# Patient Record
Sex: Female | Born: 1945 | Race: White | Hispanic: No | Marital: Married | Smoking: Never smoker
Health system: Southern US, Community
[De-identification: ages and names within clinical notes are randomized; demographics above are authoritative.]

## PROBLEM LIST (undated history)

## (undated) DIAGNOSIS — G500 Trigeminal neuralgia: Secondary | ICD-10-CM

## (undated) DIAGNOSIS — K449 Diaphragmatic hernia without obstruction or gangrene: Secondary | ICD-10-CM

## (undated) DIAGNOSIS — M4802 Spinal stenosis, cervical region: Secondary | ICD-10-CM

## (undated) DIAGNOSIS — R251 Tremor, unspecified: Secondary | ICD-10-CM

## (undated) DIAGNOSIS — N200 Calculus of kidney: Secondary | ICD-10-CM

## (undated) DIAGNOSIS — M1990 Unspecified osteoarthritis, unspecified site: Secondary | ICD-10-CM

## (undated) DIAGNOSIS — I10 Essential (primary) hypertension: Secondary | ICD-10-CM

## (undated) DIAGNOSIS — M3500 Sicca syndrome, unspecified: Secondary | ICD-10-CM

## (undated) HISTORY — PX: THYROGLOSSAL DUCT CYST: SHX297

## (undated) HISTORY — PX: TUBAL LIGATION: SHX77

## (undated) HISTORY — PX: ANTERIOR CERVICAL DECOMP/DISCECTOMY FUSION: SHX1161

## (undated) HISTORY — PX: ANKLE FRACTURE SURGERY: SHX122

## (undated) HISTORY — PX: ABDOMINAL HYSTERECTOMY: SHX81

## (undated) HISTORY — PX: SPINE SURGERY: SHX786

## (undated) HISTORY — PX: FRACTURE SURGERY: SHX138

---

## 1998-10-17 ENCOUNTER — Emergency Department (HOSPITAL_COMMUNITY): Admission: EM | Admit: 1998-10-17 | Discharge: 1998-10-17 | Payer: Self-pay | Admitting: Emergency Medicine

## 1998-10-17 ENCOUNTER — Encounter: Payer: Self-pay | Admitting: Family Medicine

## 1999-09-17 ENCOUNTER — Ambulatory Visit (HOSPITAL_COMMUNITY): Admission: RE | Admit: 1999-09-17 | Discharge: 1999-09-17 | Payer: Self-pay | Admitting: Gastroenterology

## 2000-04-25 ENCOUNTER — Encounter: Admission: RE | Admit: 2000-04-25 | Discharge: 2000-04-25 | Payer: Self-pay | Admitting: Obstetrics and Gynecology

## 2000-04-25 ENCOUNTER — Encounter: Payer: Self-pay | Admitting: Obstetrics and Gynecology

## 2000-06-10 ENCOUNTER — Other Ambulatory Visit: Admission: RE | Admit: 2000-06-10 | Discharge: 2000-06-10 | Payer: Self-pay | Admitting: Obstetrics and Gynecology

## 2001-05-04 ENCOUNTER — Encounter: Admission: RE | Admit: 2001-05-04 | Discharge: 2001-05-04 | Payer: Self-pay | Admitting: Obstetrics and Gynecology

## 2001-05-04 ENCOUNTER — Encounter: Payer: Self-pay | Admitting: Obstetrics and Gynecology

## 2001-05-11 ENCOUNTER — Encounter: Admission: RE | Admit: 2001-05-11 | Discharge: 2001-05-11 | Payer: Self-pay | Admitting: Family Medicine

## 2001-06-10 ENCOUNTER — Inpatient Hospital Stay (HOSPITAL_COMMUNITY): Admission: RE | Admit: 2001-06-10 | Discharge: 2001-06-11 | Payer: Self-pay | Admitting: Orthopaedic Surgery

## 2001-08-14 ENCOUNTER — Other Ambulatory Visit: Admission: RE | Admit: 2001-08-14 | Discharge: 2001-08-14 | Payer: Self-pay | Admitting: Internal Medicine

## 2002-03-19 ENCOUNTER — Encounter: Payer: Self-pay | Admitting: Obstetrics and Gynecology

## 2002-03-19 ENCOUNTER — Encounter: Admission: RE | Admit: 2002-03-19 | Discharge: 2002-03-19 | Payer: Self-pay | Admitting: Obstetrics and Gynecology

## 2002-07-02 ENCOUNTER — Encounter: Payer: Self-pay | Admitting: Obstetrics and Gynecology

## 2002-07-02 ENCOUNTER — Encounter: Admission: RE | Admit: 2002-07-02 | Discharge: 2002-07-02 | Payer: Self-pay | Admitting: Obstetrics and Gynecology

## 2003-02-27 ENCOUNTER — Emergency Department (HOSPITAL_COMMUNITY): Admission: EM | Admit: 2003-02-27 | Discharge: 2003-02-27 | Payer: Self-pay | Admitting: Emergency Medicine

## 2003-07-07 ENCOUNTER — Encounter: Payer: Self-pay | Admitting: Obstetrics and Gynecology

## 2003-07-07 ENCOUNTER — Encounter: Admission: RE | Admit: 2003-07-07 | Discharge: 2003-07-07 | Payer: Self-pay | Admitting: Obstetrics and Gynecology

## 2004-07-09 ENCOUNTER — Encounter: Admission: RE | Admit: 2004-07-09 | Discharge: 2004-07-09 | Payer: Self-pay | Admitting: Obstetrics and Gynecology

## 2005-07-15 ENCOUNTER — Encounter: Admission: RE | Admit: 2005-07-15 | Discharge: 2005-07-15 | Payer: Self-pay | Admitting: Obstetrics and Gynecology

## 2005-07-23 ENCOUNTER — Encounter: Admission: RE | Admit: 2005-07-23 | Discharge: 2005-07-23 | Payer: Self-pay | Admitting: Obstetrics and Gynecology

## 2006-07-18 ENCOUNTER — Encounter: Admission: RE | Admit: 2006-07-18 | Discharge: 2006-07-18 | Payer: Self-pay | Admitting: Obstetrics and Gynecology

## 2006-11-02 ENCOUNTER — Emergency Department (HOSPITAL_COMMUNITY): Admission: EM | Admit: 2006-11-02 | Discharge: 2006-11-03 | Payer: Self-pay | Admitting: Emergency Medicine

## 2006-11-06 ENCOUNTER — Ambulatory Visit (HOSPITAL_COMMUNITY): Admission: RE | Admit: 2006-11-06 | Discharge: 2006-11-07 | Payer: Self-pay | Admitting: Orthopedic Surgery

## 2007-09-08 ENCOUNTER — Encounter: Admission: RE | Admit: 2007-09-08 | Discharge: 2007-09-08 | Payer: Self-pay | Admitting: Family Medicine

## 2007-11-30 ENCOUNTER — Emergency Department (HOSPITAL_COMMUNITY): Admission: EM | Admit: 2007-11-30 | Discharge: 2007-11-30 | Payer: Self-pay | Admitting: Emergency Medicine

## 2008-10-13 ENCOUNTER — Encounter: Admission: RE | Admit: 2008-10-13 | Discharge: 2008-10-13 | Payer: Self-pay | Admitting: Family Medicine

## 2009-11-06 ENCOUNTER — Encounter: Admission: RE | Admit: 2009-11-06 | Discharge: 2009-11-06 | Payer: Self-pay | Admitting: Family Medicine

## 2010-12-25 ENCOUNTER — Encounter
Admission: RE | Admit: 2010-12-25 | Discharge: 2010-12-25 | Payer: Self-pay | Source: Home / Self Care | Attending: Internal Medicine | Admitting: Internal Medicine

## 2011-04-26 NOTE — Op Note (Signed)
[name], [name]                ACCOUNT NO.:  [id number]   MEDICAL RECORD NO.:  [id number]          PATIENT TYPE:  OIB   LOCATION:  5035                         FACILITY:  [facility]   PHYSICIAN:  Vania Rea. Supple, M.D.  DATE OF BIRTH:  29-May-1946   DATE OF PROCEDURE:  11/06/2006  DATE OF DISCHARGE:                               OPERATIVE REPORT   PREOPERATIVE DIAGNOSIS:  Displaced right ankle trimalleolar fracture  dislocation.   POSTOPERATIVE DIAGNOSIS:  Displaced right ankle trimalleolar fracture  dislocation.   PROCEDURE:  Open reduction and internal fixation of right trimalleolar  ankle fracture dislocation.   SURGEON:  Vania Rea. Supple, M.D.   ASSISTANT:  French Ana Shuford, P.A.-C.   ANESTHESIA:  Was a general endotracheal.   ESTIMATED BLOOD LOSS:  Minimal.   TOURNIQUET TIME:  Approximately 2 hours.   DRAINS:  None.   HISTORY:  Kayla Sampson is a 65 year old female who injured her right ankle  this past Sunday sustaining a trimalleolar fracture dislocation.  I  performed a closed reduction and splinting in the emergency room at  [facility] this past Sunday, and she is now brought to the operating  room for planned definitive open reduction and internal fixation.   We then counseled Ms. Muratore on treatment options as well as risks  versus benefits thereof.  Possible surgical complications of bleeding,  infection, neurovascular injury, DVT, PE, malunion, nonunion, loss of  fixation, post-traumatic arthritis and possible need for additional  surgery reviewed.  She understands, accepts and agrees with the planned  procedure.   PROCEDURE IN DETAIL:  After undergoing routine preoperative evaluation,  the patient received prophylactic antibiotics.  Brought to the operating  room and placed spine on the operating room table and underwent smooth  induction of a general endotracheal anesthesia.  Tourniquet applied to  the right thigh, and right leg was sterilely prepped and draped  in  standard fashion.  Leg was exsanguinated with the tourniquet inflated to  350 mmHg.  I began with a longitudinal incision over the medial aspect  of the distal tibia and ankle approximately 10 cm in length.  Skin flaps  were elevated and dissection then carried deeply to the distal tibial  metaphysis and over the medial malleolus and then slightly distal.  Dissection carried deeply to the tibial metaphysis were subperiosteal  dissection then used to expose anteriorly and posteriorly around the  distal tibia to allow inspection and manipulation of the posterior  malleolar fragment.  The posterior malleolar fragment did constitute 50%  of the articular surface, and so reduction and fixation was essential.  The medial malleolar fragment was rather small.  It was reflected  distally on the deltoid ligament pedicle, and this allowed inspection of  the articular surfaces.  The joint was copiously irrigated, and all  interposed soft tissue was meticulously removed.  We then turned our  attention laterally where a 10-cm incision was then made over the distal  fibular shaft, and the skin flaps were elevated, and dissection was then  carried deeply with peroneal musculature and tendons reflected  posteriorly and subperiosteal  dissection used to expose the distal  fibular shaft.  The oblique fracture site was then exposed, and  interposed soft tissue was removed.  A direct reduction was performed,  and a 7 hole plate was then contoured to fit over the posterior cortex  of the distal fibula centered over the fracture site.  This construct  was then clamped in position, and then a series of 3.5 cortical screws  were placed proximally, a lag screw obliquely across the fracture site  and then locking screws x2 distally.  Good bony purchase was achieved,  and anatomic alignment was directly visualized.  Fluoroscopic images  showed the hardware to be in good position and good alignment along the   fracture site.  Attention was then redirected medially, and using  fluoroscopic guidance, we then directly reduced the posterior malleolar  fragment and used a series of three 4.0 cancellous screws cannulated  self-tapping from anterior to posterior, lagging the posterior malleolar  fragment and maintaining good alignment at the articular surface.  Fluoroscopic images were used to confirm proper alignment and good  reduction.  We then at this point repaired the small medial malleolar  fracture fragment, again using two 4.0 self-tapping cannulated screws.  Good purchase was obtained, and good alignment was achieved clinically.  Fluoroscopic images were then obtained which showed good position of all  the hardware and good alignment across all of the fracture sites.  At  this point, the wounds were all then copiously irrigated.  Incisions  were closed with 0 Vicryl for the deep layers, 2-0 Vicryl for subcu and  intracuticular 3-0 Monocryl for the skin medially.  Steri-Strips were  applied over the incisions medially and laterally.  Bulky well-padded  short-leg plaster U splint was then applied with the ankle in neutral  position.  The tourniquet was then let down, and the patient was  extubated and taken to the recovery room in stable condition.      Vania Rea. Supple, M.D.  Electronically Signed     KMS/MEDQ  D:  11/06/2006  T:  11/07/2006  Job:  [id number]

## 2011-04-26 NOTE — Op Note (Signed)
[place]  Patient:    Kayla Sampson, Kayla Sampson                       MRN: [id number] Proc. Date: 06/10/01 Adm. 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  OPERATION PERFORMED:  C5-6, C6-7 anterior cervical diskectomy and fusion with left iliac crest bone graft.  SURGEON:  Mark C. Ophelia Charter, M.D.  ASSISTANT:  Colon Flattery. Ollen Bowl, M.D.  ANESTHESIA:  GOT.  ESTIMATED BLOOD LOSS:  100 ml.  DRAINS:  One Hemovac in the neck.  DESCRIPTION OF PROCEDURE:  After induction of general anesthesia with orotracheal intubation, head halter traction application, sand bagged behind the neck, Gelbag behind the iliac crest, the neck and left iliac crest were prepped and squared with towels, sterile skin marker, Betadine Vi-drape application.  Sterile Mayo stand at the head, thyroid sheets and drapes were applied.  An incision was made over the C6 vertebra starting with the midline and extending to the left in line with the skin fold.  The platysma was divided in line with the skin incision and then undermined.  The carotid sheath and contents were lateral and esophagus, trachea was toward the midline.  The longus colli was visualized in the midline and needle stuck at 4-5 which was the disk space above the spurring at 5-6 and C6-7.  A cross table lateral x-ray confirmed this.  Approach was first made to C5-6 with teethed retractors placed underneath the longus colli and smooth blades up and down.  Diskectomy was performed with a 15 scalpel blade and Cloward curets were used for scraping the gutters progressing up to a #1.  Uncovertebral joints were tight, particularly on the left.  Progressive drilling with the offset barrel for a 12 mm hole  was performed.  This left the level of the disk extending from the 8 to the 4 oclock position looking down toward the posterior cortex.  Progressive drilling was performed until the posterior cortex was encountered.  Operative microscope was draped and brought in.  There was a bleeder at the 6 oclock position which was controlled with bone wax.  The operating microscope draped and in position.  The posterior longitudinal ligament was taken down. Uncovertebral joints were stripped again and spurs were removed in the left gutter as well as the right.  Once the posterior longitudinal ligament was removed and the dura was well visualized and decompressed, a 14 mm plug was taken from the left iliac crest splitting the fascia in line with its fibers, using the head halter distraction and impacted into place flush with the anterior cortex based on depth gauge measurements.  An identical procedure was repeated at C6-7.  Self-retaining retractors were moved carefully and drilling down to the posterior cortex, there was a free fragment of disk which was extruded underneath the ligament sitting on the nerve root directly over the anterior portion of the nerve root.  The nerve root was erythematous.  With decompression of the uncovertebral joint, the spur of the uncovertebral joint was removed, removing part of the uncovertebral joint which was the inferior aspect.  This gave excellent visualization of the nerve  root and after irrigation with saline solution and all posterior ligament taken down with direct visualization of the dura, a second plug was taken parallel to the first.  This was thin, going from outer to inner table and the graft was countersunk 2 to 3 mm.  Neck was rotated.  Excellent stability of both grafts. Irrigation was performed.  There was room on each side for egress of any blood.  The wound was dry.  Hemovac drain was placed in line with the skin incision.  The platysma closed with  3-0, 4-0 Vicryl subcuticular skin closure. Iliac crest closed with 0 Vicryl, 2-0 Vicryl and skin staple closure. Marcaine infiltrated into both crest and neck.  The patient was transferred to the recovery room in stable condition.  Instrument and instrument counts were correct. DD:  06/10/01 TD:  06/10/01 Job: [id number]

## 2011-12-19 ENCOUNTER — Other Ambulatory Visit: Payer: Self-pay | Admitting: Internal Medicine

## 2011-12-19 DIAGNOSIS — Z1231 Encounter for screening mammogram for malignant neoplasm of breast: Secondary | ICD-10-CM

## 2012-01-07 ENCOUNTER — Ambulatory Visit
Admission: RE | Admit: 2012-01-07 | Discharge: 2012-01-07 | Disposition: A | Discharge status: Home / Self Care | Payer: BC Managed Care – PPO | Source: Ambulatory Visit | Attending: Internal Medicine | Admitting: Internal Medicine

## 2012-01-07 DIAGNOSIS — Z1231 Encounter for screening mammogram for malignant neoplasm of breast: Secondary | ICD-10-CM

## 2013-01-04 ENCOUNTER — Other Ambulatory Visit: Payer: Self-pay | Admitting: Internal Medicine

## 2013-01-04 DIAGNOSIS — Z1231 Encounter for screening mammogram for malignant neoplasm of breast: Secondary | ICD-10-CM

## 2013-01-28 ENCOUNTER — Ambulatory Visit
Admission: RE | Admit: 2013-01-28 | Discharge: 2013-01-28 | Disposition: A | Discharge status: Home / Self Care | Payer: BC Managed Care – PPO | Source: Ambulatory Visit | Attending: Internal Medicine | Admitting: Internal Medicine

## 2013-01-28 DIAGNOSIS — Z1231 Encounter for screening mammogram for malignant neoplasm of breast: Secondary | ICD-10-CM

## 2014-01-05 ENCOUNTER — Other Ambulatory Visit: Payer: Self-pay

## 2014-01-05 DIAGNOSIS — Z1231 Encounter for screening mammogram for malignant neoplasm of breast: Secondary | ICD-10-CM

## 2014-02-02 ENCOUNTER — Ambulatory Visit: Payer: BC Managed Care – PPO

## 2014-02-10 ENCOUNTER — Ambulatory Visit
Admission: RE | Admit: 2014-02-10 | Discharge: 2014-02-10 | Disposition: A | Discharge status: Home / Self Care | Payer: 59 | Source: Ambulatory Visit

## 2014-02-10 DIAGNOSIS — Z1231 Encounter for screening mammogram for malignant neoplasm of breast: Secondary | ICD-10-CM

## 2015-02-08 ENCOUNTER — Other Ambulatory Visit: Payer: Self-pay

## 2015-02-08 DIAGNOSIS — Z1231 Encounter for screening mammogram for malignant neoplasm of breast: Secondary | ICD-10-CM

## 2015-02-15 ENCOUNTER — Ambulatory Visit
Admission: RE | Admit: 2015-02-15 | Discharge: 2015-02-15 | Disposition: A | Discharge status: Home / Self Care | Payer: Medicare Other | Source: Ambulatory Visit

## 2015-02-15 DIAGNOSIS — Z1231 Encounter for screening mammogram for malignant neoplasm of breast: Secondary | ICD-10-CM

## 2015-12-10 HISTORY — DX: Omphalitis not of newborn: L08.82

## 2016-02-11 ENCOUNTER — Encounter (HOSPITAL_BASED_OUTPATIENT_CLINIC_OR_DEPARTMENT_OTHER): Payer: Self-pay | Admitting: *Deleted

## 2016-02-11 ENCOUNTER — Emergency Department (HOSPITAL_BASED_OUTPATIENT_CLINIC_OR_DEPARTMENT_OTHER)
Admission: EM | Admit: 2016-02-11 | Discharge: 2016-02-11 | Disposition: A | Discharge status: Home / Self Care | Payer: Medicare Other | Attending: Emergency Medicine | Admitting: Emergency Medicine

## 2016-02-11 ENCOUNTER — Emergency Department (HOSPITAL_BASED_OUTPATIENT_CLINIC_OR_DEPARTMENT_OTHER): Payer: Medicare Other

## 2016-02-11 DIAGNOSIS — M3500 Sicca syndrome, unspecified: Secondary | ICD-10-CM | POA: Insufficient documentation

## 2016-02-11 DIAGNOSIS — Y9389 Activity, other specified: Secondary | ICD-10-CM | POA: Insufficient documentation

## 2016-02-11 DIAGNOSIS — L03116 Cellulitis of left lower limb: Secondary | ICD-10-CM | POA: Diagnosis not present

## 2016-02-11 DIAGNOSIS — Z8719 Personal history of other diseases of the digestive system: Secondary | ICD-10-CM | POA: Diagnosis not present

## 2016-02-11 DIAGNOSIS — I10 Essential (primary) hypertension: Secondary | ICD-10-CM | POA: Insufficient documentation

## 2016-02-11 DIAGNOSIS — M1990 Unspecified osteoarthritis, unspecified site: Secondary | ICD-10-CM | POA: Insufficient documentation

## 2016-02-11 DIAGNOSIS — Z7982 Long term (current) use of aspirin: Secondary | ICD-10-CM | POA: Diagnosis not present

## 2016-02-11 DIAGNOSIS — Z23 Encounter for immunization: Secondary | ICD-10-CM | POA: Diagnosis not present

## 2016-02-11 DIAGNOSIS — Z8669 Personal history of other diseases of the nervous system and sense organs: Secondary | ICD-10-CM | POA: Insufficient documentation

## 2016-02-11 DIAGNOSIS — Z87442 Personal history of urinary calculi: Secondary | ICD-10-CM | POA: Diagnosis not present

## 2016-02-11 DIAGNOSIS — W1839XA Other fall on same level, initial encounter: Secondary | ICD-10-CM | POA: Insufficient documentation

## 2016-02-11 DIAGNOSIS — Y9289 Other specified places as the place of occurrence of the external cause: Secondary | ICD-10-CM | POA: Diagnosis not present

## 2016-02-11 DIAGNOSIS — S8992XA Unspecified injury of left lower leg, initial encounter: Secondary | ICD-10-CM | POA: Diagnosis present

## 2016-02-11 DIAGNOSIS — L02416 Cutaneous abscess of left lower limb: Secondary | ICD-10-CM | POA: Insufficient documentation

## 2016-02-11 DIAGNOSIS — Z79899 Other long term (current) drug therapy: Secondary | ICD-10-CM | POA: Diagnosis not present

## 2016-02-11 DIAGNOSIS — L02419 Cutaneous abscess of limb, unspecified: Secondary | ICD-10-CM

## 2016-02-11 DIAGNOSIS — T148XXA Other injury of unspecified body region, initial encounter: Secondary | ICD-10-CM

## 2016-02-11 DIAGNOSIS — Y998 Other external cause status: Secondary | ICD-10-CM | POA: Diagnosis not present

## 2016-02-11 DIAGNOSIS — S8002XA Contusion of left knee, initial encounter: Secondary | ICD-10-CM | POA: Insufficient documentation

## 2016-02-11 DIAGNOSIS — L03119 Cellulitis of unspecified part of limb: Secondary | ICD-10-CM

## 2016-02-11 HISTORY — DX: Spinal stenosis, cervical region: M48.02

## 2016-02-11 HISTORY — DX: Calculus of kidney: N20.0

## 2016-02-11 HISTORY — DX: Trigeminal neuralgia: G50.0

## 2016-02-11 HISTORY — DX: Unspecified osteoarthritis, unspecified site: M19.90

## 2016-02-11 HISTORY — DX: Essential (primary) hypertension: I10

## 2016-02-11 HISTORY — DX: Sjogren syndrome, unspecified: M35.00

## 2016-02-11 HISTORY — DX: Diaphragmatic hernia without obstruction or gangrene: K44.9

## 2016-02-11 HISTORY — DX: Tremor, unspecified: R25.1

## 2016-02-11 MED ORDER — TETANUS-DIPHTH-ACELL PERTUSSIS 5-2.5-18.5 LF-MCG/0.5 IM SUSP
0.5000 mL | Freq: Once | INTRAMUSCULAR | Status: AC
  Administered 2016-02-11: 0.5 mL via INTRAMUSCULAR
  Filled 2016-02-11: qty 0.5

## 2016-02-11 MED ORDER — DOXYCYCLINE HYCLATE 100 MG PO CAPS: 100.0000 mg | ORAL_CAPSULE | Freq: Two times a day (BID) | ORAL | Status: DC

## 2016-02-11 NOTE — ED Notes (Signed)
Pt reports a fall on 01/28/16 injuring her left knee, pt states yesterday she began developing redness and edema - knee is also hot to touch.

## 2016-02-11 NOTE — ED Provider Notes (Signed)
CSN: [id number]     Arrival date & time 02/11/16  1736 History   First MD Initiated Contact with Patient 02/11/16 1830     Chief Complaint  Patient presents with  . Knee Pain     (Consider location/radiation/quality/duration/timing/severity/associated sxs/prior Treatment) HPI   Pt with hx cervical stenosis of spine with occasional associated balance issues presents with left knee pain following fall on February 19 (2 weeks ago).  States she fell forward onto her left knee, had a cut over the knee.  It was initially red, they put neosporin on it and it improved.  Yesterday she developed increased soreness and redness of the same area.  Denies fevers, weakness or numbness of the leg, leg swelling, calf pain, difficulty moving the joint.    Past Medical History  Diagnosis Date  . Cervical stenosis of spine   . Sjoegren syndrome (HCC)   . Hypertension   . Trigeminal neuralgia pain   . Hiatal hernia   . Occasional tremors   . Osteoarthritis   . Kidney stone    Past Surgical History  Procedure Laterality Date  . Thyroglossal duct cyst    . Tubal ligation    . Abdominal hysterectomy    . Anterior cervical decomp/discectomy fusion    . Ankle fracture surgery     History reviewed. No pertinent family history. Social History  Substance Use Topics  . Smoking status: Never Smoker   . Smokeless tobacco: None  . Alcohol Use: No   OB History    No data available     Review of Systems  All other systems reviewed and are negative.     Allergies  Lisinopril  Home Medications   Prior to Admission medications   Medication Sig Start Date End Date Taking? Authorizing Provider  aspirin EC 81 MG tablet Take 81 mg by mouth daily.   Yes Historical Provider, MD  cevimeline (EVOXAC) 30 MG capsule Take 30 mg by mouth 3 (three) times daily.   Yes Historical Provider, MD  DULoxetine (CYMBALTA) 20 MG capsule Take 20 mg by mouth daily.   Yes Historical Provider, MD  hydroxychloroquine  (PLAQUENIL) 200 MG tablet Take by mouth daily.   Yes Historical Provider, MD  metoprolol tartrate (LOPRESSOR) 25 MG tablet Take 25 mg by mouth 2 (two) times daily.   Yes Historical Provider, MD  ranitidine (ZANTAC) 75 MG tablet Take 75 mg by mouth 2 (two) times daily.   Yes Historical Provider, MD  risedronate (ACTONEL) 35 MG tablet Take 35 mg by mouth every 7 (seven) days. with water on empty stomach, nothing by mouth or lie down for next 30 minutes.   Yes Historical Provider, MD   BP 179/73 mmHg  Pulse 72  Temp(Src) 97.3 F (36.3 C) (Oral)  Resp 18  Ht 5\' 4"  (1.626 m)  Wt 89.359 kg  BMI 33.80 kg/m2  SpO2 100% Physical Exam  Constitutional: She appears well-developed and well-nourished. No distress.  HENT:  Head: Normocephalic and atraumatic.  Neck: Neck supple.  Pulmonary/Chest: Effort normal.  Musculoskeletal:       Left knee: She exhibits ecchymosis and erythema. She exhibits normal range of motion, no effusion, no deformity, normal alignment, no LCL laxity, no bony tenderness and no MCL laxity.       Left ankle: Normal.       Left upper leg: Normal.       Left lower leg: She exhibits tenderness and bony tenderness. She exhibits no swelling, no edema, no  deformity and no laceration.       Left foot: Normal.  Left Lower Extremity:  Left anterior knee with area of swelling with tenderness, ecchymosis, slight erythema.  Pt also has light ecchymosis over distal anteromedial lower leg with mild tenderness.  Full active and passive ROM of left knee without pain.  No laxity of the joint.  Distal pulses and sensation intact.  No fullness or tenderness of the calf of thigh.    Neurological: She is alert.  Skin: She is not diaphoretic.  Nursing note and vitals reviewed.   ED Course  Procedures (including critical care time) Labs Review Labs Reviewed - No data to display  Imaging Review Dg Tibia/fibula Left  02/11/2016  CLINICAL DATA:  Fall 01/28/2016 injuring left knee. Swelling and  ecchymosis over anterior knee and distal shin. EXAM: LEFT TIBIA AND FIBULA - 2 VIEW COMPARISON:  None. FINDINGS: No fracture or dislocation. Ankle alignment appears maintained. Soft tissue prominence about the proximal lower leg anteriorly. No radiopaque foreign body. IMPRESSION: No fracture.  Soft tissue prominence anteriorly. Electronically Signed   By: Rubye Oaks M.D.   On: 02/11/2016 19:25   Dg Knee Complete 4 Views Left  02/11/2016  CLINICAL DATA:  Fall 01/28/2016 injuring left knee. Anterior knee swelling with ecchymosis. EXAM: LEFT KNEE - COMPLETE 4+ VIEW COMPARISON:  None. FINDINGS: No fracture or dislocation. The alignment is maintained. Medial tibial femoral mild patellofemoral joint space narrowing. Superior patellar spurring. No joint effusion. Anterior soft tissue prominence, primarily in the prepatellar region. No soft tissue air. IMPRESSION: 1. No fracture or dislocation.  Mild osteoarthritis. 2. Prepatellar soft tissue prominence, may be related to hematoma. Electronically Signed   By: Rubye Oaks M.D.   On: 02/11/2016 19:23      EKG Interpretation None      MDM   Final diagnoses:  Left knee injury, initial encounter  Hematoma  Cellulitis and abscess of leg    Afebrile nontoxic patient with localized superficial swelling over left anterior knee at site of injury two weeks ago.  Neurovascularly intact.  No diffuse edema, no calf tenderness, no palpable cords.   Does not involve the joint itself- pt has normal ROM without pain, no effusion.Pt also seen and examined by Dr Fredderick Phenix. Suspect hematoma with possible small area of cellulitis related to the cut she sustained from the fall.  Tdap updated. Xray unremarkable.  D/C home with doxycycline, 2 day recheck. Pt given return precautions.  Pt verbalizes understanding and agrees with plan.      I doubt any other EMC precluding discharge at this time including, but not necessarily limited to the following: septic joint, DVT.     Trixie Dredge, PA-C 02/11/16 2100  Rolan Bucco, MD 02/11/16 2123

## 2016-02-11 NOTE — Discharge Instructions (Signed)
Read the information below.  Use the prescribed medication as directed.  Please discuss all new medications with your pharmacist.  You may return to the Emergency Department at any time for worsening condition or any new symptoms that concern you.    If you develop fever, uncontrolled pain, weakness or numbness of the extremity, severe discoloration of the skin, increased redness or swelling, or you are unable to walk, return to the ER for a recheck.      Cellulitis Cellulitis is an infection of the skin and the tissue beneath it. The infected area is usually red and tender. Cellulitis occurs most often in the arms and lower legs.  CAUSES  Cellulitis is caused by bacteria that enter the skin through cracks or cuts in the skin. The most common types of bacteria that cause cellulitis are staphylococci and streptococci. SIGNS AND SYMPTOMS   Redness and warmth.  Swelling.  Tenderness or pain.  Fever. DIAGNOSIS  Your health care provider can usually determine what is wrong based on a physical exam. Blood tests may also be done. TREATMENT  Treatment usually involves taking an antibiotic medicine. HOME CARE INSTRUCTIONS   Take your antibiotic medicine as directed by your health care provider. Finish the antibiotic even if you start to feel better.  Keep the infected arm or leg elevated to reduce swelling.  Apply a warm cloth to the affected area up to 4 times per day to relieve pain.  Take medicines only as directed by your health care provider.  Keep all follow-up visits as directed by your health care provider. SEEK MEDICAL CARE IF:   You notice red streaks coming from the infected area.  Your red area gets larger or turns dark in color.  Your bone or joint underneath the infected area becomes painful after the skin has healed.  Your infection returns in the same area or another area.  You notice a swollen bump in the infected area.  You develop new symptoms.  You have a  fever. SEEK IMMEDIATE MEDICAL CARE IF:   You feel very sleepy.  You develop vomiting or diarrhea.  You have a general ill feeling (malaise) with muscle aches and pains.   This information is not intended to replace advice given to you by your health care provider. Make sure you discuss any questions you have with your health care provider.   Document Released: 09/04/2005 Document Revised: 08/16/2015 Document Reviewed: 02/10/2012 Elsevier Interactive Patient Education [employer]2016 [employer]vier Inc.

## 2016-02-11 NOTE — ED Notes (Signed)
PA at bedside.

## 2016-02-13 DIAGNOSIS — H2513 Age-related nuclear cataract, bilateral: Secondary | ICD-10-CM

## 2016-02-13 DIAGNOSIS — M1990 Unspecified osteoarthritis, unspecified site: Secondary | ICD-10-CM | POA: Insufficient documentation

## 2016-02-13 DIAGNOSIS — J309 Allergic rhinitis, unspecified: Secondary | ICD-10-CM

## 2016-02-13 DIAGNOSIS — M8580 Other specified disorders of bone density and structure, unspecified site: Secondary | ICD-10-CM | POA: Insufficient documentation

## 2016-02-13 DIAGNOSIS — K219 Gastro-esophageal reflux disease without esophagitis: Secondary | ICD-10-CM | POA: Insufficient documentation

## 2016-02-13 DIAGNOSIS — L0882 Omphalitis not of newborn: Secondary | ICD-10-CM | POA: Insufficient documentation

## 2016-02-13 DIAGNOSIS — M4802 Spinal stenosis, cervical region: Secondary | ICD-10-CM

## 2016-02-13 DIAGNOSIS — H524 Presbyopia: Secondary | ICD-10-CM | POA: Insufficient documentation

## 2016-02-13 DIAGNOSIS — Z79899 Other long term (current) drug therapy: Secondary | ICD-10-CM

## 2016-02-13 DIAGNOSIS — L570 Actinic keratosis: Secondary | ICD-10-CM | POA: Insufficient documentation

## 2016-02-13 DIAGNOSIS — H269 Unspecified cataract: Secondary | ICD-10-CM | POA: Insufficient documentation

## 2016-02-13 DIAGNOSIS — M3501 Sicca syndrome with keratoconjunctivitis: Secondary | ICD-10-CM | POA: Insufficient documentation

## 2016-02-13 HISTORY — DX: Actinic keratosis: L57.0

## 2016-02-13 HISTORY — DX: Allergic rhinitis, unspecified: J30.9

## 2016-02-13 HISTORY — DX: Other long term (current) drug therapy: Z79.899

## 2016-02-13 HISTORY — DX: Other specified disorders of bone density and structure, unspecified site: M85.80

## 2016-02-13 HISTORY — DX: Presbyopia: H52.4

## 2016-02-13 HISTORY — DX: Unspecified cataract: H26.9

## 2016-02-13 HISTORY — DX: Unspecified osteoarthritis, unspecified site: M19.90

## 2016-02-13 HISTORY — DX: Sjogren syndrome with keratoconjunctivitis: M35.01

## 2016-02-13 HISTORY — DX: Age-related nuclear cataract, bilateral: H25.13

## 2016-02-13 HISTORY — DX: Spinal stenosis, cervical region: M48.02

## 2016-02-13 HISTORY — DX: Gastro-esophageal reflux disease without esophagitis: K21.9

## 2016-02-20 ENCOUNTER — Other Ambulatory Visit: Payer: Self-pay | Admitting: Internal Medicine

## 2016-02-20 DIAGNOSIS — N649 Disorder of breast, unspecified: Secondary | ICD-10-CM

## 2016-02-23 ENCOUNTER — Ambulatory Visit
Admission: RE | Admit: 2016-02-23 | Discharge: 2016-02-23 | Disposition: A | Discharge status: Home / Self Care | Payer: Medicare Other | Source: Ambulatory Visit | Attending: Internal Medicine | Admitting: Internal Medicine

## 2016-02-23 DIAGNOSIS — N649 Disorder of breast, unspecified: Secondary | ICD-10-CM

## 2016-03-06 DIAGNOSIS — D649 Anemia, unspecified: Secondary | ICD-10-CM

## 2016-03-06 DIAGNOSIS — S52501A Unspecified fracture of the lower end of right radius, initial encounter for closed fracture: Secondary | ICD-10-CM

## 2016-03-06 DIAGNOSIS — W19XXXA Unspecified fall, initial encounter: Secondary | ICD-10-CM | POA: Insufficient documentation

## 2016-03-06 DIAGNOSIS — I10 Essential (primary) hypertension: Secondary | ICD-10-CM

## 2016-03-06 DIAGNOSIS — S52614A Nondisplaced fracture of right ulna styloid process, initial encounter for closed fracture: Secondary | ICD-10-CM | POA: Insufficient documentation

## 2016-03-06 DIAGNOSIS — Y92009 Unspecified place in unspecified non-institutional (private) residence as the place of occurrence of the external cause: Secondary | ICD-10-CM | POA: Insufficient documentation

## 2016-03-06 DIAGNOSIS — S62101A Fracture of unspecified carpal bone, right wrist, initial encounter for closed fracture: Secondary | ICD-10-CM

## 2016-03-06 HISTORY — DX: Unspecified fracture of the lower end of right radius, initial encounter for closed fracture: S52.501A

## 2016-03-06 HISTORY — DX: Unspecified fall, initial encounter: Y92.009

## 2016-03-06 HISTORY — DX: Fracture of unspecified carpal bone, right wrist, initial encounter for closed fracture: S62.101A

## 2016-03-06 HISTORY — DX: Nondisplaced fracture of right ulna styloid process, initial encounter for closed fracture: S52.614A

## 2016-03-06 HISTORY — DX: Essential (primary) hypertension: I10

## 2016-03-06 HISTORY — DX: Anemia, unspecified: D64.9

## 2016-04-08 DIAGNOSIS — R2689 Other abnormalities of gait and mobility: Secondary | ICD-10-CM

## 2016-04-08 DIAGNOSIS — M542 Cervicalgia: Secondary | ICD-10-CM

## 2016-04-08 HISTORY — DX: Cervicalgia: M54.2

## 2016-04-08 HISTORY — DX: Other abnormalities of gait and mobility: R26.89

## 2017-01-30 ENCOUNTER — Other Ambulatory Visit: Payer: Self-pay | Admitting: Internal Medicine

## 2017-01-30 DIAGNOSIS — Z1231 Encounter for screening mammogram for malignant neoplasm of breast: Secondary | ICD-10-CM

## 2017-02-27 ENCOUNTER — Ambulatory Visit
Admission: RE | Admit: 2017-02-27 | Discharge: 2017-02-27 | Disposition: A | Discharge status: Home / Self Care | Payer: Medicare Other | Source: Ambulatory Visit | Attending: Internal Medicine | Admitting: Internal Medicine

## 2017-02-27 DIAGNOSIS — Z1231 Encounter for screening mammogram for malignant neoplasm of breast: Secondary | ICD-10-CM

## 2017-04-01 DIAGNOSIS — R0602 Shortness of breath: Secondary | ICD-10-CM

## 2017-04-01 HISTORY — DX: Shortness of breath: R06.02

## 2017-04-30 DIAGNOSIS — F320 Major depressive disorder, single episode, mild: Secondary | ICD-10-CM | POA: Insufficient documentation

## 2017-04-30 HISTORY — DX: Major depressive disorder, single episode, mild: F32.0

## 2017-06-30 DIAGNOSIS — H353131 Nonexudative age-related macular degeneration, bilateral, early dry stage: Secondary | ICD-10-CM | POA: Insufficient documentation

## 2017-06-30 DIAGNOSIS — Z79899 Other long term (current) drug therapy: Secondary | ICD-10-CM

## 2017-06-30 HISTORY — DX: Nonexudative age-related macular degeneration, bilateral, early dry stage: H35.3131

## 2017-06-30 HISTORY — DX: Other long term (current) drug therapy: Z79.899

## 2017-09-06 LAB — GLUCOSE, POCT (MANUAL RESULT ENTRY): POC Glucose: 133 mg/dL — AB (ref 70–99)

## 2018-02-27 ENCOUNTER — Other Ambulatory Visit: Payer: Self-pay | Admitting: Internal Medicine

## 2018-02-27 DIAGNOSIS — Z1231 Encounter for screening mammogram for malignant neoplasm of breast: Secondary | ICD-10-CM

## 2018-03-24 ENCOUNTER — Ambulatory Visit
Admission: RE | Admit: 2018-03-24 | Discharge: 2018-03-24 | Disposition: A | Discharge status: Home / Self Care | Payer: Medicare Other | Source: Ambulatory Visit | Attending: Internal Medicine | Admitting: Internal Medicine

## 2018-03-24 DIAGNOSIS — Z1231 Encounter for screening mammogram for malignant neoplasm of breast: Secondary | ICD-10-CM

## 2018-11-30 DIAGNOSIS — H43393 Other vitreous opacities, bilateral: Secondary | ICD-10-CM | POA: Insufficient documentation

## 2018-11-30 HISTORY — DX: Other vitreous opacities, bilateral: H43.393

## 2019-02-12 ENCOUNTER — Other Ambulatory Visit: Payer: Self-pay | Admitting: Internal Medicine

## 2019-02-12 DIAGNOSIS — Z1231 Encounter for screening mammogram for malignant neoplasm of breast: Secondary | ICD-10-CM

## 2019-03-29 ENCOUNTER — Ambulatory Visit: Payer: Medicare Other

## 2019-05-17 ENCOUNTER — Ambulatory Visit
Admission: RE | Admit: 2019-05-17 | Discharge: 2019-05-17 | Disposition: A | Discharge status: Home / Self Care | Payer: Medicare Other | Source: Ambulatory Visit | Attending: Internal Medicine | Admitting: Internal Medicine

## 2019-05-17 ENCOUNTER — Other Ambulatory Visit: Payer: Self-pay

## 2019-05-17 DIAGNOSIS — Z1231 Encounter for screening mammogram for malignant neoplasm of breast: Secondary | ICD-10-CM

## 2019-05-20 DIAGNOSIS — M545 Low back pain, unspecified: Secondary | ICD-10-CM

## 2019-05-20 HISTORY — DX: Low back pain, unspecified: M54.50

## 2020-01-17 ENCOUNTER — Ambulatory Visit: Payer: Medicare PPO | Attending: Internal Medicine

## 2020-01-17 DIAGNOSIS — Z23 Encounter for immunization: Secondary | ICD-10-CM | POA: Insufficient documentation

## 2020-01-17 NOTE — Progress Notes (Signed)
   Covid-19 Vaccination Clinic  Name:  INELLA KUWAHARA    MRN: [id number] DOB: 01-23-1946  01/17/2020  Ms. Principato was observed post Covid-19 immunization for 15 minutes without incidence. She was provided with Vaccine Information Sheet and instruction to access the V-Safe system.   Ms. Stelzner was instructed to call 911 with any severe reactions post vaccine: Marland Kitchen Difficulty breathing  . Swelling of your face and throat  . A fast heartbeat  . A bad rash all over your body  . Dizziness and weakness    Immunizations Administered    Name Date Dose VIS Date Route   Pfizer COVID-19 Vaccine 01/17/2020 10:56 AM 0.3 mL 11/19/2019 Intramuscular   Manufacturer: [employer], [employer]   Lot: [id number]   NDC: [address]-8

## 2020-01-22 ENCOUNTER — Ambulatory Visit: Payer: Medicare Other

## 2020-02-11 ENCOUNTER — Ambulatory Visit: Payer: Medicare PPO | Attending: Internal Medicine

## 2020-02-11 ENCOUNTER — Other Ambulatory Visit: Payer: Self-pay

## 2020-02-11 DIAGNOSIS — Z23 Encounter for immunization: Secondary | ICD-10-CM | POA: Insufficient documentation

## 2020-02-11 NOTE — Progress Notes (Signed)
   Covid-19 Vaccination Clinic  Name:  Kayla Sampson    MRN: [id number] DOB: 08/14/1946  02/11/2020  [name] was observed post Covid-19 immunization for 15 minutes without incident. She was provided with Vaccine Information Sheet and instruction to access the V-Safe system.   [name] was instructed to call 911 with any severe reactions post vaccine: Marland Kitchen Difficulty breathing  . Swelling of face and throat  . A fast heartbeat  . A bad rash all over body  . Dizziness and weakness   Immunizations Administered    Name Date Dose VIS Date Route   Pfizer COVID-19 Vaccine 02/11/2020  8:45 AM 0.3 mL 11/19/2019 Intramuscular   Manufacturer: [employer], [employer]   Lot: [id number]   NDC: [address]-3

## 2020-02-16 DIAGNOSIS — N200 Calculus of kidney: Secondary | ICD-10-CM

## 2020-02-16 DIAGNOSIS — Z79899 Other long term (current) drug therapy: Secondary | ICD-10-CM

## 2020-02-16 DIAGNOSIS — R269 Unspecified abnormalities of gait and mobility: Secondary | ICD-10-CM

## 2020-02-16 HISTORY — DX: Other long term (current) drug therapy: Z79.899

## 2020-02-16 HISTORY — DX: Calculus of kidney: N20.0

## 2020-02-16 HISTORY — DX: Unspecified abnormalities of gait and mobility: R26.9

## 2020-02-28 DIAGNOSIS — H0102A Squamous blepharitis right eye, upper and lower eyelids: Secondary | ICD-10-CM | POA: Insufficient documentation

## 2020-02-28 DIAGNOSIS — H18529 Epithelial (juvenile) corneal dystrophy, unspecified eye: Secondary | ICD-10-CM

## 2020-02-28 DIAGNOSIS — H04123 Dry eye syndrome of bilateral lacrimal glands: Secondary | ICD-10-CM

## 2020-02-28 HISTORY — DX: Epithelial (juvenile) corneal dystrophy, unspecified eye: H18.529

## 2020-02-28 HISTORY — DX: Dry eye syndrome of bilateral lacrimal glands: H04.123

## 2020-02-28 HISTORY — DX: Squamous blepharitis right eye, upper and lower eyelids: H01.02A

## 2020-05-11 ENCOUNTER — Other Ambulatory Visit: Payer: Self-pay | Admitting: Internal Medicine

## 2020-05-11 DIAGNOSIS — Z1231 Encounter for screening mammogram for malignant neoplasm of breast: Secondary | ICD-10-CM

## 2020-05-19 ENCOUNTER — Other Ambulatory Visit: Payer: Self-pay

## 2020-05-19 ENCOUNTER — Ambulatory Visit
Admission: RE | Admit: 2020-05-19 | Discharge: 2020-05-19 | Disposition: A | Discharge status: Home / Self Care | Payer: Medicare PPO | Source: Ambulatory Visit | Attending: Internal Medicine | Admitting: Internal Medicine

## 2020-05-19 DIAGNOSIS — Z1231 Encounter for screening mammogram for malignant neoplasm of breast: Secondary | ICD-10-CM

## 2020-09-27 ENCOUNTER — Other Ambulatory Visit: Payer: Self-pay | Admitting: Internal Medicine

## 2021-04-30 ENCOUNTER — Other Ambulatory Visit: Payer: Self-pay

## 2021-04-30 DIAGNOSIS — Z1231 Encounter for screening mammogram for malignant neoplasm of breast: Secondary | ICD-10-CM

## 2021-05-05 DIAGNOSIS — N179 Acute kidney failure, unspecified: Secondary | ICD-10-CM

## 2021-05-05 HISTORY — DX: Acute kidney failure, unspecified: N17.9

## 2021-06-25 ENCOUNTER — Other Ambulatory Visit: Payer: Self-pay

## 2021-06-25 ENCOUNTER — Ambulatory Visit
Admission: RE | Admit: 2021-06-25 | Discharge: 2021-06-25 | Disposition: A | Discharge status: Home / Self Care | Payer: Medicare PPO | Source: Ambulatory Visit

## 2021-06-25 DIAGNOSIS — Z1231 Encounter for screening mammogram for malignant neoplasm of breast: Secondary | ICD-10-CM

## 2021-06-26 DIAGNOSIS — B029 Zoster without complications: Secondary | ICD-10-CM

## 2021-06-26 HISTORY — DX: Zoster without complications: B02.9

## 2021-11-03 DIAGNOSIS — E6609 Other obesity due to excess calories: Secondary | ICD-10-CM

## 2021-11-03 HISTORY — DX: Other obesity due to excess calories: E66.09

## 2022-04-11 ENCOUNTER — Emergency Department (HOSPITAL_BASED_OUTPATIENT_CLINIC_OR_DEPARTMENT_OTHER): Payer: Medicare PPO

## 2022-04-11 ENCOUNTER — Emergency Department (HOSPITAL_BASED_OUTPATIENT_CLINIC_OR_DEPARTMENT_OTHER): Payer: Medicare PPO | Admitting: Radiology

## 2022-04-11 ENCOUNTER — Other Ambulatory Visit: Payer: Self-pay

## 2022-04-11 ENCOUNTER — Emergency Department (HOSPITAL_BASED_OUTPATIENT_CLINIC_OR_DEPARTMENT_OTHER)
Admission: EM | Admit: 2022-04-11 | Discharge: 2022-04-11 | Disposition: A | Discharge status: Home / Self Care | Payer: Medicare PPO | Attending: Emergency Medicine | Admitting: Emergency Medicine

## 2022-04-11 ENCOUNTER — Encounter (HOSPITAL_BASED_OUTPATIENT_CLINIC_OR_DEPARTMENT_OTHER): Payer: Self-pay | Admitting: *Deleted

## 2022-04-11 DIAGNOSIS — S60511A Abrasion of right hand, initial encounter: Secondary | ICD-10-CM | POA: Diagnosis not present

## 2022-04-11 DIAGNOSIS — M79671 Pain in right foot: Secondary | ICD-10-CM | POA: Insufficient documentation

## 2022-04-11 DIAGNOSIS — Y9301 Activity, walking, marching and hiking: Secondary | ICD-10-CM | POA: Diagnosis not present

## 2022-04-11 DIAGNOSIS — Z7982 Long term (current) use of aspirin: Secondary | ICD-10-CM | POA: Insufficient documentation

## 2022-04-11 DIAGNOSIS — Y92481 Parking lot as the place of occurrence of the external cause: Secondary | ICD-10-CM | POA: Insufficient documentation

## 2022-04-11 DIAGNOSIS — W101XXA Fall (on)(from) sidewalk curb, initial encounter: Secondary | ICD-10-CM | POA: Diagnosis not present

## 2022-04-11 DIAGNOSIS — S0033XA Contusion of nose, initial encounter: Secondary | ICD-10-CM | POA: Diagnosis not present

## 2022-04-11 DIAGNOSIS — S0083XA Contusion of other part of head, initial encounter: Secondary | ICD-10-CM | POA: Diagnosis not present

## 2022-04-11 DIAGNOSIS — S0990XA Unspecified injury of head, initial encounter: Secondary | ICD-10-CM | POA: Diagnosis present

## 2022-04-11 DIAGNOSIS — I10 Essential (primary) hypertension: Secondary | ICD-10-CM | POA: Diagnosis not present

## 2022-04-11 DIAGNOSIS — Z79899 Other long term (current) drug therapy: Secondary | ICD-10-CM | POA: Insufficient documentation

## 2022-04-11 DIAGNOSIS — W19XXXA Unspecified fall, initial encounter: Secondary | ICD-10-CM

## 2022-04-11 MED ORDER — ACETAMINOPHEN 500 MG PO TABS
1000.0000 mg | ORAL_TABLET | Freq: Once | ORAL | Status: AC
  Administered 2022-04-11: 1000 mg via ORAL
  Filled 2022-04-11: qty 2

## 2022-04-11 NOTE — ED Notes (Signed)
Non stick dressing applied to abrasions on hand ?

## 2022-04-11 NOTE — Discharge Instructions (Signed)
As we discussed, your work-up in the ER today was reassuring for acute abnormalities.  I recommend rest, ice, compression, and elevation of areas of pain.  You may also take Tylenol/ibuprofen as needed.  Additionally given some questionable findings on your CT scan regarding a potential polyp or mass near your vocal cords, I have given you a referral to ENT with a number to call for further evaluation and management of this. ? ?Return if development of any new or worsening symptoms. ?

## 2022-04-11 NOTE — ED Triage Notes (Signed)
Pt is here after tripping on curb and striking her head and abrasion to fingers on right hand.  No LOC, not on blood thinners.  ?

## 2022-04-11 NOTE — ED Provider Notes (Signed)
?[facility]PT ?Provider Note ? ? ?CSN: [id number] ?Arrival date & time: 04/11/22  1559 ? ?  ? ?History ? ?Chief Complaint  ?Patient presents with  ? Fall  ? ? ?Kayla Sampson is a 76 y.o. female. ? ?Patient with history of hypertension and Sjogrens presents today with complaints of fall. She states that same occurred earlier today when she was walking though the parking lot. States that she was walking holding a box and couldn't see where she was going and tripped on the curb and fell with her face striking the pavement. She denies any loss of consciousness and states that she was able to get up and walk around on scene without difficulty. She is not currently anticoagulated. She does endorse some pain and swelling to the face and nose with associated right hand and right foot pain. Denies any blurred vision, dizziness, or lightheadedness.   ? ?The history is provided by the patient. No language interpreter was used.  ?Fall ?Associated symptoms include headaches.  ? ?  ? ?Home Medications ?Prior to Admission medications   ?Medication Sig Start Date End Date Taking? Authorizing Provider  ?aspirin EC 81 MG tablet Take 81 mg by mouth daily.    [provider]  ?cevimeline (EVOXAC) 30 MG capsule Take 30 mg by mouth 3 (three) times daily.    [provider]  ?doxycycline (VIBRAMYCIN) 100 MG capsule Take 1 capsule (100 mg total) by mouth 2 (two) times daily. One po bid x 7 days 02/11/16   Trixie Dredge, PA-C  ?DULoxetine (CYMBALTA) 20 MG capsule Take 20 mg by mouth daily.    [provider]  ?hydroxychloroquine (PLAQUENIL) 200 MG tablet Take by mouth daily.    [provider]  ?metoprolol tartrate (LOPRESSOR) 25 MG tablet Take 25 mg by mouth 2 (two) times daily.    [provider]  ?ranitidine (ZANTAC) 75 MG tablet Take 75 mg by mouth 2 (two) times daily.    [provider]  ?risedronate (ACTONEL) 35 MG tablet Take 35 mg by mouth every 7 (seven)  days. with water on empty stomach, nothing by mouth or lie down for next 30 minutes.    [provider]  ?   ? ?Allergies    ?Lisinopril   ? ?Review of Systems   ?Review of Systems  ?Constitutional:  Negative for chills and fever.  ?Eyes:  Negative for photophobia and visual disturbance.  ?Musculoskeletal:  Positive for arthralgias and myalgias.  ?Neurological:  Positive for headaches. Negative for dizziness, tremors, seizures, syncope, facial asymmetry, speech difficulty, weakness, light-headedness and numbness.  ?Psychiatric/Behavioral:  Negative for confusion and decreased concentration.   ?All other systems reviewed and are negative. ? ?Physical Exam ?Updated Vital Signs ?BP (!) 145/54   Pulse 65   Temp 98.2 ?F (36.8 ?C) (Oral)   Resp 14   SpO2 100%  ?Physical Exam ?Vitals and nursing note reviewed.  ?Constitutional:   ?   General: She is not in acute distress. ?   Appearance: Normal appearance. She is normal weight. She is not ill-appearing, toxic-appearing or diaphoretic.  ?HENT:  ?   Head: Normocephalic and atraumatic.  ?   Comments: Patient with hematoma to the middle of the forehead at the hair line. No crepitus or wound. Small hematoma noted to the bridge of the nose. ?Eyes:  ?   Extraocular Movements: Extraocular movements intact.  ?   Pupils: Pupils are equal, round, and reactive to light.  ?Cardiovascular:  ?  Rate and Rhythm: Normal rate and regular rhythm.  ?   Heart sounds: Normal heart sounds.  ?Pulmonary:  ?   Effort: Pulmonary effort is normal. No respiratory distress.  ?   Breath sounds: Normal breath sounds.  ?Abdominal:  ?   General: Abdomen is flat.  ?   Palpations: Abdomen is soft.  ?Musculoskeletal:     ?   General: Normal range of motion.  ?   Cervical back: Normal range of motion and neck supple. No tenderness.  ?   Comments: No midline cervical, thoracic, or lumbar tenderness ? ?Several abrasions noted to the back of fingers 2-5 on the right hand. Bleeding controlled. No  obvious foreign bodies noted. Full ROM intact to the right hand and fingers. Capillary refill less than 2 seconds.  ? ?Tenderness to palpation to the base of the right great toe. No obvious swelling, bruising, or deformity noted. No wounds present.  ?Skin: ?   General: Skin is warm and dry.  ?Neurological:  ?   General: No focal deficit present.  ?   Mental Status: She is alert.  ?Psychiatric:     ?   Mood and Affect: Mood normal.     ?   Behavior: Behavior normal.  ? ? ?ED Results / Procedures / Treatments   ?Labs ?(all labs ordered are listed, but only abnormal results are displayed) ?Labs Reviewed - No data to display ? ?EKG ?None ? ?Radiology ?CT Head Wo Contrast ? ?Result Date: 04/11/2022 ?CLINICAL DATA:  Fall with trauma to the head, face and neck. EXAM: CT HEAD WITHOUT CONTRAST CT MAXILLOFACIAL WITHOUT CONTRAST CT CERVICAL SPINE WITHOUT CONTRAST TECHNIQUE: Multidetector CT imaging of the head, cervical spine, and maxillofacial structures were performed using the standard protocol without intravenous contrast. Multiplanar CT image reconstructions of the cervical spine and maxillofacial structures were also generated. RADIATION DOSE REDUCTION: This exam was performed according to the departmental dose-optimization program which includes automated exposure control, adjustment of the mA and/or kV according to patient size and/or use of iterative reconstruction technique. COMPARISON:  11/01/2015.  06/24/2013. FINDINGS: CT HEAD FINDINGS Brain: Mild age related volume loss. No evidence of old or acute focal infarction, mass lesion, hemorrhage, hydrocephalus or extra-axial collection. Vascular: There is atherosclerotic calcification of the major vessels at the base of the brain. Skull: No skull fracture. Other: Right frontal scalp hematoma. CT MAXILLOFACIAL FINDINGS Osseous: No facial fracture. Orbits: Orbits appear normal.  No traumatic finding. Sinuses: Clear Soft tissues: Negative CT CERVICAL SPINE FINDINGS  Alignment: No traumatic malalignment. Skull base and vertebrae: Chronic fusion C5 through C7. No evidence of regional fracture. Soft tissues and spinal canal: Negative Disc levels: Chronic fusion from C5 through C7. Ordinary spondylosis at C3-4 and C4-5 but no canal stenosis. Left facet degeneration at C3-4 and C4-5 with mild left foraminal narrowing. Upper chest: Negative Other: Question polyp or mass at the aryepiglottic fold on the right. Suggest ENT referral for evaluation of this finding. IMPRESSION: Head CT: No skull fracture. No traumatic intracranial finding. Right frontal scalp swelling. Maxillofacial CT: No traumatic finding. Cervical spine CT: No traumatic finding. Chronic fusion C5 through C7. Mild degenerative changes. Question polyp or mass associated with the aryepiglottic fold on the right, measuring up to 1.5 cm in size. Suggest ENT referral for follow-up of this abnormality. Electronically Signed   By: Paulina FusiMark  Shogry M.D.   On: 04/11/2022 21:11  ? ?CT Cervical Spine Wo Contrast ? ?Result Date: 04/11/2022 ?CLINICAL DATA:  Fall with trauma to  the head, face and neck. EXAM: CT HEAD WITHOUT CONTRAST CT MAXILLOFACIAL WITHOUT CONTRAST CT CERVICAL SPINE WITHOUT CONTRAST TECHNIQUE: Multidetector CT imaging of the head, cervical spine, and maxillofacial structures were performed using the standard protocol without intravenous contrast. Multiplanar CT image reconstructions of the cervical spine and maxillofacial structures were also generated. RADIATION DOSE REDUCTION: This exam was performed according to the departmental dose-optimization program which includes automated exposure control, adjustment of the mA and/or kV according to patient size and/or use of iterative reconstruction technique. COMPARISON:  11/01/2015.  06/24/2013. FINDINGS: CT HEAD FINDINGS Brain: Mild age related volume loss. No evidence of old or acute focal infarction, mass lesion, hemorrhage, hydrocephalus or extra-axial collection.  Vascular: There is atherosclerotic calcification of the major vessels at the base of the brain. Skull: No skull fracture. Other: Right frontal scalp hematoma. CT MAXILLOFACIAL FINDINGS Osseous: No facial fracture. Orbits: Or

## 2022-05-08 DIAGNOSIS — J387 Other diseases of larynx: Secondary | ICD-10-CM

## 2022-05-08 HISTORY — DX: Other diseases of larynx: J38.7

## 2022-05-15 ENCOUNTER — Other Ambulatory Visit: Payer: Self-pay

## 2022-05-15 DIAGNOSIS — Z1231 Encounter for screening mammogram for malignant neoplasm of breast: Secondary | ICD-10-CM

## 2022-05-17 DIAGNOSIS — R7303 Prediabetes: Secondary | ICD-10-CM | POA: Insufficient documentation

## 2022-05-17 HISTORY — DX: Prediabetes: R73.03

## 2022-05-31 ENCOUNTER — Ambulatory Visit: Payer: Medicare PPO | Admitting: Family Medicine

## 2022-05-31 ENCOUNTER — Encounter: Payer: Self-pay | Admitting: Family Medicine

## 2022-05-31 VITALS — BP 158/70 | HR 60 | Temp 96.9°F | Ht 64.0 in | Wt 189.4 lb

## 2022-05-31 DIAGNOSIS — M3509 Sicca syndrome with other organ involvement: Secondary | ICD-10-CM

## 2022-05-31 DIAGNOSIS — R7303 Prediabetes: Secondary | ICD-10-CM | POA: Diagnosis not present

## 2022-05-31 DIAGNOSIS — N390 Urinary tract infection, site not specified: Secondary | ICD-10-CM

## 2022-05-31 DIAGNOSIS — G250 Essential tremor: Secondary | ICD-10-CM

## 2022-05-31 DIAGNOSIS — N3281 Overactive bladder: Secondary | ICD-10-CM

## 2022-05-31 DIAGNOSIS — I10 Essential (primary) hypertension: Secondary | ICD-10-CM | POA: Diagnosis not present

## 2022-05-31 DIAGNOSIS — K219 Gastro-esophageal reflux disease without esophagitis: Secondary | ICD-10-CM

## 2022-05-31 DIAGNOSIS — K449 Diaphragmatic hernia without obstruction or gangrene: Secondary | ICD-10-CM | POA: Insufficient documentation

## 2022-05-31 DIAGNOSIS — F320 Major depressive disorder, single episode, mild: Secondary | ICD-10-CM

## 2022-05-31 HISTORY — DX: Sjogren syndrome with other organ involvement: M35.09

## 2022-05-31 HISTORY — DX: Overactive bladder: N32.81

## 2022-05-31 HISTORY — DX: Urinary tract infection, site not specified: N39.0

## 2022-05-31 HISTORY — DX: Essential tremor: G25.0

## 2022-05-31 MED ORDER — METOPROLOL SUCCINATE ER 25 MG PO TB24: 25.0000 mg | ORAL_TABLET | ORAL | 0 refills | Status: DC

## 2022-06-02 NOTE — Assessment & Plan Note (Signed)
Controlled on Myrbetriq Follows with urology

## 2022-06-02 NOTE — Assessment & Plan Note (Signed)
Stable Associated with Sjogren's Continue to monitor

## 2022-06-02 NOTE — Assessment & Plan Note (Signed)
Controlled on metoprolol, Refilled

## 2022-06-04 ENCOUNTER — Ambulatory Visit: Payer: Medicare PPO | Admitting: Medical

## 2022-06-05 ENCOUNTER — Telehealth: Payer: Self-pay | Admitting: Family Medicine

## 2022-06-05 MED ORDER — AMLODIPINE-OLMESARTAN 10-20 MG PO TABS: 1.0000 | ORAL_TABLET | Freq: Every day | ORAL | 1 refills | Status: DC

## 2022-06-05 NOTE — Telephone Encounter (Signed)
Chart supports rx. Last OV: 05/31/2022 Next OV: 06/28/2022

## 2022-06-05 NOTE — Telephone Encounter (Signed)
Advised patient that refill has been sent.  

## 2022-06-05 NOTE — Telephone Encounter (Signed)
Pt needs a refill for Amlodipine She uses [facility] on [address].

## 2022-06-06 ENCOUNTER — Other Ambulatory Visit: Payer: Self-pay

## 2022-06-07 MED ORDER — AMLODIPINE-OLMESARTAN 10-20 MG PO TABS: 1.0000 | ORAL_TABLET | Freq: Every day | ORAL | 0 refills | Status: DC

## 2022-06-07 NOTE — Telephone Encounter (Signed)
Spoke with patient after receiving request for 90 day supply of amlodipine/olmesartan. She states she normally receives a 90 day supply    Contacted [facility] and canceled 30 with 1 refill of amlodipine/olmesartan 10-20mg  tablet.

## 2022-06-25 ENCOUNTER — Ambulatory Visit (INDEPENDENT_AMBULATORY_CARE_PROVIDER_SITE_OTHER): Payer: Medicare PPO | Admitting: Family Medicine

## 2022-06-25 ENCOUNTER — Encounter: Payer: Self-pay | Admitting: Family Medicine

## 2022-06-25 DIAGNOSIS — U071 COVID-19: Secondary | ICD-10-CM

## 2022-06-25 MED ORDER — MOLNUPIRAVIR EUA 200MG CAPSULE: 4.0000 | ORAL_CAPSULE | Freq: Two times a day (BID) | ORAL | 0 refills | Status: AC

## 2022-06-25 MED ORDER — BENZONATATE 100 MG PO CAPS: 100.0000 mg | ORAL_CAPSULE | Freq: Two times a day (BID) | ORAL | 0 refills | Status: DC | PRN

## 2022-06-25 NOTE — Progress Notes (Signed)
Virtual Visit via Telephone Note  I connected with Kayla Sampson on 06/25/22 at  9:00 AM EDT by telephone and verified that I am speaking with the correct person using two identifiers.  Location: Patient: Home Provider: Office   I discussed the limitations, risks, security and privacy concerns of performing an evaluation and management service by telephone and the availability of in person appointments. I also discussed with the patient that there may be a patient responsible charge related to this service. The patient expressed understanding and agreed to proceed.   History of Present Illness:  Patient calling complaining of COVID-19 symptoms.  This started 4 days ago.  Patient did test positive x2. at home patient.  Initially developed a sore throat, this then progressed to nausea, cough, and change in taste.  Patient denies chest pain and shortness of breath.  She is tolerating PO fluids.  She has not been not taking any medication for her symptoms.  She has been vaccinated and is up-to-date on latest booster.   Observations/Objective: Respiratory: Able to talk without any distress  Assessment and Plan:  COVID-19 Given age and history of connective tissue disorder, will start molnupiravir.  Cannot take Paxlovid due to be on hydroxychloroquine. Tessalon Perles for cough Declines anything for nausea Recommend Tylenol for chills and aches Recommend go to the emergency department patient has severe pain or shortness of breath Quarantine precautions discussed  Follow Up Instructions:    I discussed the assessment and treatment plan with the patient. The patient was provided an opportunity to ask questions and all were answered. The patient agreed with the plan and demonstrated an understanding of the instructions.   The patient was advised to call back or seek an in-person evaluation if the symptoms worsen or if the condition fails to improve as anticipated.  I provided 12 minutes  of non-face-to-face time during this encounter.   Garnette Gunner, MD

## 2022-06-26 ENCOUNTER — Ambulatory Visit: Payer: Medicare PPO

## 2022-06-27 ENCOUNTER — Telehealth: Payer: Self-pay | Admitting: Family Medicine

## 2022-06-27 NOTE — Telephone Encounter (Signed)
Please advise, see below.   

## 2022-06-27 NOTE — Telephone Encounter (Signed)
Caller Name: Atha Muradyan Call back phone #: [phone]  Reason for Call: Pt said she went to pick up her medications and they only gave her 1 dose of  Metoprolol, she asked for a call back as to why.

## 2022-06-28 ENCOUNTER — Ambulatory Visit: Payer: Medicare PPO | Admitting: Family Medicine

## 2022-06-28 NOTE — Telephone Encounter (Signed)
Advised patient of annotation below and she verbalized understanding.  

## 2022-07-01 ENCOUNTER — Telehealth: Payer: Self-pay | Admitting: Family Medicine

## 2022-07-01 ENCOUNTER — Other Ambulatory Visit: Payer: Self-pay | Admitting: Family Medicine

## 2022-07-01 DIAGNOSIS — G250 Essential tremor: Secondary | ICD-10-CM

## 2022-07-01 DIAGNOSIS — I10 Essential (primary) hypertension: Secondary | ICD-10-CM

## 2022-07-01 MED ORDER — METOPROLOL SUCCINATE ER 25 MG PO TB24: 25.0000 mg | ORAL_TABLET | Freq: Every day | ORAL | 0 refills | Status: DC

## 2022-07-01 NOTE — Telephone Encounter (Signed)
Caller Name: Kaila Devries Call back phone #: [phone]  Reason for Call: Pt said that she was prescribed metroprolol by Dr. Janee Morn. Her pharmacy is stating that they have not received an order yet. Asked to be called back to speak about this

## 2022-07-01 NOTE — Telephone Encounter (Signed)
Spoke with patient and advised her of annotation below and she verbalized understanding.

## 2022-07-01 NOTE — Telephone Encounter (Signed)
Please advise, see below. Was the metoprolol just 1 pill for the month of June or is there a refill needed for July as well.  Patient scheduled for f/u on 07/08/2022

## 2022-07-08 ENCOUNTER — Encounter: Payer: Self-pay | Admitting: Family Medicine

## 2022-07-08 ENCOUNTER — Ambulatory Visit: Payer: Medicare PPO | Admitting: Family Medicine

## 2022-07-08 ENCOUNTER — Ambulatory Visit
Admission: RE | Admit: 2022-07-08 | Discharge: 2022-07-08 | Disposition: A | Discharge status: Home / Self Care | Payer: Medicare PPO | Source: Ambulatory Visit

## 2022-07-08 VITALS — BP 138/86 | HR 74 | Temp 97.6°F | Wt 187.2 lb

## 2022-07-08 DIAGNOSIS — R7303 Prediabetes: Secondary | ICD-10-CM

## 2022-07-08 DIAGNOSIS — U071 COVID-19: Secondary | ICD-10-CM

## 2022-07-08 DIAGNOSIS — G250 Essential tremor: Secondary | ICD-10-CM

## 2022-07-08 DIAGNOSIS — I10 Essential (primary) hypertension: Secondary | ICD-10-CM

## 2022-07-08 DIAGNOSIS — Z1231 Encounter for screening mammogram for malignant neoplasm of breast: Secondary | ICD-10-CM

## 2022-07-08 HISTORY — DX: COVID-19: U07.1

## 2022-07-08 LAB — HEMOGLOBIN A1C: Hgb A1c MFr Bld: 6.3 % (ref 4.6–6.5)

## 2022-07-08 NOTE — Assessment & Plan Note (Signed)
Stable Recheck hemoglobin A1c Continue metformin 500 mg daily

## 2022-07-08 NOTE — Assessment & Plan Note (Signed)
Controlled on metoprolol XR 25 mg daily Continue monitor

## 2022-07-08 NOTE — Progress Notes (Addendum)
Assessment/Plan:   Problem List Items Addressed This Visit       Cardiovascular and Mediastinum   Primary hypertension    Chronic Stable Continue amlodipine/olmesartan 10/20 mg daily, metoprolol XR 25 mg daily        Nervous and Auditory   Benign essential tremor syndrome    Controlled on metoprolol XR 25 mg daily Continue monitor         Other   Prediabetes - Primary    Stable Recheck hemoglobin A1c Continue metformin 500 mg daily      Relevant Orders   Hemoglobin A1C   COVID-19    Recent infection Overall improving Continue monitor          Subjective:  HPI:  Kayla Sampson is a 76 y.o. 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   She  has a past medical history of Cervical stenosis of spine, Hiatal hernia, Hypertension, Kidney stone, Occasional tremors, Osteoarthritis, Sjoegren syndrome, and Trigeminal neuralgia pain..   She presents with chief complaint of Follow-up (4 week follow up on B/p and tremor. Right ear pain x 1 month ago. ) .   Tremor: She complains of tremor. Tremor primarily involves the bilateral hand.  Onset of symptoms was  gradual over several years . Symptoms are currently of mild severity. Tremor exacerbated by activity (intention) and heat. Tremor is alleviated by metoprolol.   Hypertension, established problem, Stable BP Readings from Last 3 Encounters:  07/08/22 138/86  05/31/22 (!) 158/70  04/11/22 (!) 143/93    Current Medications: Amlodipine/olmesartan 10 mg / 20 mg, metoprolol XR 25 mg, compliant without  side effects. Interim History: Doing well  ROS: Denies any chest pain, shortness of breath, dyspnea on exertion, leg edema.   Patient with history of recent COVID.  She reports that she has some mild fatigue but overall no chest pain or shortness of breath.  She feels that she is improving well.  Pre-DIABETES , established problem, Stable Medications: Metformin 500 mg, Reports taking and tolerating without side effects. Interim History-no complaints  ROS: Denies denies polyuria polydipsia polyuria,Polydipsia, Denies Hypoglycemia symptoms (palpitations, tremors, anxiousness)  Hemoglobin A1c 6 months ago was 6.3   Past Surgical History:  Procedure Laterality Date   ABDOMINAL HYSTERECTOMY     ANKLE FRACTURE SURGERY     ANTERIOR CERVICAL DECOMP/DISCECTOMY FUSION     THYROGLOSSAL DUCT CYST     TUBAL LIGATION      Outpatient Medications Prior to Visit  Medication Sig Dispense Refill   amlodipine-olmesartan (AZOR) 10-20 MG tablet Take 1 tablet by mouth daily. 90 tablet 0   Cholecalciferol (VITAMIN D-1000 MAX ST) 25 MCG (1000 UT) tablet Take by mouth.     DULoxetine (CYMBALTA) 60 MG capsule TAKE 1 CAPSULE(60 MG) BY MOUTH DAILY     Flaxseed, Linseed, (FLAXSEED OIL) 1200 MG CAPS Take by mouth.     folic acid (FOLVITE) 1 MG tablet Take 1 tablet by mouth daily.     hydroxychloroquine (PLAQUENIL) 200 MG tablet Take by mouth daily.     metFORMIN (GLUCOPHAGE-XR) 500 MG 24 hr tablet Take 500 mg by mouth daily.     methotrexate (RHEUMATREX) 2.5 MG tablet SMARTSIG:7 Tablet(s) By Mouth  Once a Week     metoprolol succinate (TOPROL-XL) 25 MG 24 hr tablet TAKE 1 TABLET(25 MG) BY MOUTH DAILY 90 tablet 0   Multiple Vitamins-Minerals (CENTRUM SILVER 50+WOMEN PO) Take by mouth.     MYRBETRIQ 25 MG TB24 tablet Take 25 mg by mouth daily.     OVER THE COUNTER MEDICATION PreserVision Eye vitamin and mineral supplement     benzonatate (TESSALON) 100 MG capsule Take 1 capsule (100 mg total) by mouth 2 (two)  times daily as needed for cough. (Patient not taking: Reported on 07/08/2022) 20 capsule 0   No facility-administered medications prior to visit.    No family history on file.  Social History   Socioeconomic History   Marital status: Married    Spouse name: Not on file   Number of children: Not on file   Years of education: Not on file   Highest education level: Not on file  Occupational History   Not on file  Tobacco Use   Smoking status: Never   Smokeless tobacco: Not on file  Vaping Use   Vaping Use: Never used  Substance and Sexual Activity   Alcohol use: No   Drug use: No   Sexual activity: Not on file  Other Topics Concern   Not on file  Social History Narrative   Not on file   Social Determinants of Health   Financial Resource Strain: Not on file  Food Insecurity: Not on file  Transportation Needs: Not on file  Physical Activity: Not on file  Stress: Not on file  Social Connections: Not on file  Intimate Partner Violence: Not on file                                                                                                 Objective:  Physical Exam: BP 138/86 (BP Location: Left Arm, Patient Position: Sitting, Cuff Size: Large)   Pulse 74   Temp 97.6 F (36.4 C) (Temporal)   Wt 187 lb 3.2 oz (84.9 kg)   SpO2 97%   BMI 32.13 kg/m    General: No acute distress. Awake and conversant.  Eyes: Normal conjunctiva, anicteric. Round symmetric pupils.  ENT: Hearing grossly intact. No nasal discharge.  Neck: Neck is supple. No masses or thyromegaly.  Respiratory: Respirations are non-labored. No auditory wheezing.  Skin: Warm. No rashes or ulcers.  Psych: Alert and oriented. Cooperative, Appropriate mood and affect, Normal judgment.  CV: No cyanosis or JVD MSK: Normal ambulation. No clubbing  Neuro: Sensation and CN II-XII grossly normal.  Mild resting tremor bilateral upper extremities       Garner Nash, MD, MS

## 2022-07-08 NOTE — Patient Instructions (Signed)
Blood pressure looks good.  I am glad you are doing well from COVID. We are checking hemoglobin A1c today.

## 2022-07-08 NOTE — Assessment & Plan Note (Signed)
Chronic Stable Continue amlodipine/olmesartan 10/20 mg daily, metoprolol XR 25 mg daily

## 2022-07-08 NOTE — Assessment & Plan Note (Signed)
Recent infection Overall improving Continue monitor

## 2022-07-11 ENCOUNTER — Telehealth: Payer: Self-pay | Admitting: Family Medicine

## 2022-07-11 ENCOUNTER — Other Ambulatory Visit: Payer: Self-pay

## 2022-07-11 DIAGNOSIS — F320 Major depressive disorder, single episode, mild: Secondary | ICD-10-CM

## 2022-07-11 DIAGNOSIS — R928 Other abnormal and inconclusive findings on diagnostic imaging of breast: Secondary | ICD-10-CM

## 2022-07-11 MED ORDER — DULOXETINE HCL 60 MG PO CPEP: ORAL_CAPSULE | ORAL | 3 refills | Status: DC

## 2022-07-11 NOTE — Telephone Encounter (Signed)
Caller Name: PT Call back phone #: [phone]  MEDICATION(S): DULoxetine (CYMBALTA) 60 MG capsule [[id number]]   Days of Med Remaining: she has none  Has the patient contacted their pharmacy (YES/NO)? Yes contact your pcp  Preferred Pharmacy: [facility] #[id number] [place], [place] - [address] AT [facility] OF [address] & [address]  [address] [place] [address]  Phone:  [phone]  Fax:  [phone]  DEA #:  [id number]  ~~~Please advise patient/caregiver to allow 2-3 business days to process RX refills.

## 2022-07-11 NOTE — Telephone Encounter (Signed)
Advised patient of the annotation below and she verbalized understanding.

## 2022-07-15 ENCOUNTER — Other Ambulatory Visit: Payer: Self-pay | Admitting: Family Medicine

## 2022-07-15 ENCOUNTER — Telehealth: Payer: Self-pay | Admitting: Family Medicine

## 2022-07-15 DIAGNOSIS — R928 Other abnormal and inconclusive findings on diagnostic imaging of breast: Secondary | ICD-10-CM

## 2022-07-15 NOTE — Telephone Encounter (Signed)
Patient received mammogram on 07/08/22 that resulted further imaging is needed (MM DIAG BREAST TOMO UNI LEFT & US BREAST LTD UNI LEFT INC AXILLA), but orders are required and patient has appointment on tomorrow 07/16/2022 with [facility]. Can you please advise in Dr. Carollee Massed absence?

## 2022-07-15 NOTE — Telephone Encounter (Signed)
Caller Name: [facility] Call back phone #: [phone]  Reason for Call: Can you please confirm the order has been signed and sent to [facility] for pt to be seen 8/08

## 2022-07-16 ENCOUNTER — Ambulatory Visit
Admission: RE | Admit: 2022-07-16 | Discharge: 2022-07-16 | Disposition: A | Discharge status: Home / Self Care | Payer: Medicare PPO | Source: Ambulatory Visit | Attending: Family Medicine | Admitting: Family Medicine

## 2022-07-16 ENCOUNTER — Other Ambulatory Visit: Payer: Medicare PPO

## 2022-07-16 DIAGNOSIS — R928 Other abnormal and inconclusive findings on diagnostic imaging of breast: Secondary | ICD-10-CM

## 2022-07-26 ENCOUNTER — Telehealth: Payer: Self-pay | Admitting: Family Medicine

## 2022-07-26 MED ORDER — METFORMIN HCL ER 500 MG PO TB24: 500.0000 mg | ORAL_TABLET | Freq: Every day | ORAL | 2 refills | Status: DC

## 2022-07-26 NOTE — Telephone Encounter (Signed)
Chart supports rx. Last OV: 07/08/2022  Advised patient that there was a 90 day supply sent in of metoprolol succinate (TOPROL-XL) 25 MG on 07/01/22 #90 with 0 refills. Patient stated she will contact pharmacy. Advised patient that metformin will be sent today.

## 2022-07-26 NOTE — Telephone Encounter (Signed)
Caller Name: Thara  Call back phone #: [phone]  MEDICATION(S): metoprolol succinate (TOPROL-XL) 25 MG 24 hr tablet and  metFORMIN  Days of Med Remaining:   Has the patient contacted their pharmacy (YES/NO)? n What did pharmacy advise?  Preferred Pharmacy:  [facility] #[id number] - [place], [place] - [address] AT [facility] OF [address] & [address] Phone:  [phone]      ~~~Please advise patient/caregiver to allow 2-3 business days to process RX refills.

## 2022-08-13 ENCOUNTER — Ambulatory Visit (INDEPENDENT_AMBULATORY_CARE_PROVIDER_SITE_OTHER): Payer: Medicare PPO

## 2022-08-13 VITALS — Ht 63.0 in | Wt 185.0 lb

## 2022-08-13 DIAGNOSIS — Z0000 Encounter for general adult medical examination without abnormal findings: Secondary | ICD-10-CM | POA: Diagnosis not present

## 2022-08-13 DIAGNOSIS — Z780 Asymptomatic menopausal state: Secondary | ICD-10-CM | POA: Diagnosis not present

## 2022-08-13 NOTE — Progress Notes (Signed)
Subjective:   Kayla Sampson is a 76 y.o. female who presents for Medicare Annual (Subsequent) preventive examination.   Virtual Visit via Telephone Note  I connected with  Dennis Bast on 08/13/22 at  3:45 PM EDT by telephone and verified that I am speaking with the correct person using two identifiers.  Location: Patient: home  Provider: [place]  Persons participating in the virtual visit: patient/Nurse Health Advisor   I discussed the limitations, risks, security and privacy concerns of performing an evaluation and management service by telephone and the availability of in person appointments. The patient expressed understanding and agreed to proceed.  Interactive audio and video telecommunications were attempted between this nurse and patient, however failed, due to patient having technical difficulties OR patient did not have access to video capability.  We continued and completed visit with audio only.  Some vital signs may be absent or patient reported.   Lorrene Reid, LPN  Review of Systems     Cardiac Risk Factors include: advanced age (>9men, >50 women);hypertension     Objective:    Today's Vitals   08/13/22 1531  Weight: 185 lb (83.9 kg)  Height: 5\' 3"  (1.6 m)   Body mass index is 32.77 kg/m.     08/13/2022    3:37 PM 04/11/2022    4:37 PM 02/11/2016    6:04 PM  Advanced Directives  Does Patient Have a Medical Advance Directive? Yes Yes Yes  Type of 04/12/2016 of [place];Living will Healthcare Power of [place];Living will Healthcare Power of [place];Living will;Out of facility DNR (pink MOST or yellow form)  Copy of Healthcare Power of Attorney in Chart? No - copy requested  No - copy requested    Current Medications (verified) Outpatient Encounter Medications as of 08/13/2022  Medication Sig   amlodipine-olmesartan (AZOR) 10-20 MG tablet Take 1 tablet by mouth daily.   Cholecalciferol (VITAMIN D-1000 MAX ST) 25 MCG (1000 UT)  tablet Take by mouth.   DULoxetine (CYMBALTA) 60 MG capsule TAKE 1 CAPSULE(60 MG) BY MOUTH DAILY   Flaxseed, Linseed, (FLAXSEED OIL) 1200 MG CAPS Take by mouth.   folic acid (FOLVITE) 1 MG tablet Take 1 tablet by mouth daily.   hydroxychloroquine (PLAQUENIL) 200 MG tablet Take by mouth daily.   metFORMIN (GLUCOPHAGE-XR) 500 MG 24 hr tablet Take 1 tablet (500 mg total) by mouth daily.   methotrexate (RHEUMATREX) 2.5 MG tablet SMARTSIG:7 Tablet(s) By Mouth Once a Week   metoprolol succinate (TOPROL-XL) 25 MG 24 hr tablet TAKE 1 TABLET(25 MG) BY MOUTH DAILY   Multiple Vitamins-Minerals (CENTRUM SILVER 50+WOMEN PO) Take by mouth.   MYRBETRIQ 25 MG TB24 tablet Take 25 mg by mouth daily.   OVER THE COUNTER MEDICATION PreserVision Eye vitamin and mineral supplement   No facility-administered encounter medications on file as of 08/13/2022.    Allergies (verified) Carbamazepine, Lisinopril, and Sulfamethoxazole   History: Past Medical History:  Diagnosis Date   Cervical stenosis of spine    Hiatal hernia    Hypertension    Kidney stone    Occasional tremors    Osteoarthritis    Sjoegren syndrome    Trigeminal neuralgia pain    Past Surgical History:  Procedure Laterality Date   ABDOMINAL HYSTERECTOMY     ANKLE FRACTURE SURGERY     ANTERIOR CERVICAL DECOMP/DISCECTOMY FUSION     THYROGLOSSAL DUCT CYST     TUBAL LIGATION     History reviewed. No pertinent family history. Social History  Socioeconomic History   Marital status: Married    Spouse name: Not on file   Number of children: Not on file   Years of education: Not on file   Highest education level: Not on file  Occupational History   Not on file  Tobacco Use   Smoking status: Never   Smokeless tobacco: Not on file  Vaping Use   Vaping Use: Never used  Substance and Sexual Activity   Alcohol use: No   Drug use: No   Sexual activity: Not on file  Other Topics Concern   Not on file  Social History Narrative   Not on  file   Social Determinants of Health   Financial Resource Strain: Low Risk  (08/13/2022)   Overall Financial Resource Strain (CARDIA)    Difficulty of Paying Living Expenses: Not hard at all  Food Insecurity: No Food Insecurity (08/13/2022)   Hunger Vital Sign    Worried About Running Out of Food in the Last Year: Never true    Ran Out of Food in the Last Year: Never true  Transportation Needs: No Transportation Needs (08/13/2022)   PRAPARE - Administrator, Civil Service (Medical): No    Lack of Transportation (Non-Medical): No  Physical Activity: Insufficiently Active (08/13/2022)   Exercise Vital Sign    Days of Exercise per Week: 2 days    Minutes of Exercise per Session: 10 min  Stress: No Stress Concern Present (08/13/2022)   [employer] of Occupational Health - Occupational Stress Questionnaire    Feeling of Stress : Not at all  Social Connections: Moderately Isolated (08/13/2022)   Social Connection and Isolation Panel [NHANES]    Frequency of Communication with Friends and Family: More than three times a week    Frequency of Social Gatherings with Friends and Family: More than three times a week    Attends Religious Services: Never    Database administrator or Organizations: No    Attends Engineer, structural: Never    Marital Status: Married    Tobacco Counseling Counseling given: Not Answered   Clinical Intake:  Pre-visit preparation completed: Yes  Pain : No/denies pain     Nutritional Risks: None Diabetes: No  How often do you need to have someone help you when you read instructions, pamphlets, or other written materials from your doctor or pharmacy?: 1 - Never  Diabetic?no   Interpreter Needed?: No  Information entered by :: Renie Ora , LPN   Activities of Daily Living    08/13/2022    3:37 PM 08/13/2022    3:06 PM  In your present state of health, do you have any difficulty performing the following activities:  Hearing? 0 0   Vision? 0 1  Difficulty concentrating or making decisions? 0 0  Walking or climbing stairs? 0 1  Dressing or bathing? 0 0  Doing errands, shopping? 0 0  Preparing Food and eating ? N N  Using the Toilet? N N  In the past six months, have you accidently leaked urine? N N  Do you have problems with loss of bowel control? N N  Managing your Medications? N N  Managing your Finances? N N  Housekeeping or managing your Housekeeping? N N    Patient Care Team: Garnette Gunner, MD as PCP - General (Family Medicine)  Indicate any recent Medical Services you may have received from other than [facility] providers in the past year (date may be approximate).  Assessment:   This is a routine wellness examination for Hani.  Hearing/Vision screen Vision Screening - Comments:: Annual ey exams wears glasses   Dietary issues and exercise activities discussed: Current Exercise Habits: Home exercise routine, Type of exercise: strength training/weights, Time (Minutes): 10, Frequency (Times/Week): 2, Weekly Exercise (Minutes/Week): 20, Intensity: Mild, Exercise limited by: orthopedic condition(s)   Goals Addressed             This Visit's Progress    Exercise 3x per week (30 min per time)         Depression Screen    08/13/2022    3:36 PM 07/08/2022   11:18 AM 05/31/2022   11:11 AM  PHQ 2/9 Scores  PHQ - 2 Score 0 0 0  PHQ- 9 Score 0 1     Fall Risk    08/13/2022    3:33 PM 08/13/2022    3:06 PM  Fall Risk   Falls in the past year? 1 1  Number falls in past yr: 1 1  Injury with Fall? 0 1  Risk for fall due to : History of fall(s);Impaired balance/gait;Orthopedic patient   Follow up Education provided;Falls prevention discussed     FALL RISK PREVENTION PERTAINING TO THE HOME:  Any stairs in or around the home? No  If so, are there any without handrails? No  Home free of loose throw rugs in walkways, pet beds, electrical cords, etc? Yes  Adequate lighting in your home to reduce  risk of falls? No   ASSISTIVE DEVICES UTILIZED TO PREVENT FALLS:  Life alert? No  Use of a cane, [place] or w/c? No  Grab bars in the bathroom? Yes  Shower chair or bench in shower? No  Elevated toilet seat or a handicapped toilet? No      08/13/2022    3:38 PM  6CIT Screen  What Year? 0 points  What month? 0 points  What time? 0 points  Count back from 20 0 points  Months in reverse 0 points  Repeat phrase 2 points  Total Score 2 points    Immunizations Immunization History  Administered Date(s) Administered   Influenza, High Dose Seasonal PF 09/18/2020, 09/14/2021   Influenza,inj,Quad PF,6+ Mos 09/06/2017, 08/29/2018   PFIZER Comirnaty(Gray Top)Covid-19 Tri-Sucrose Vaccine 08/02/2020, 03/23/2021   PFIZER(Purple Top)SARS-COV-2 Vaccination 01/17/2020, 02/11/2020   Pfizer Covid-19 Vaccine Bivalent Booster 43yrs & up 09/14/2021   Pneumococcal Conjugate-13 12/12/2014   Pneumococcal Polysaccharide-23 12/26/2008   Td 12/26/2008   Tdap 02/11/2016   Zoster, Live 12/26/2008    TDAP status: Up to date  Flu Vaccine status: Up to date  Pneumococcal vaccine status: Up to date  Covid-19 vaccine status: Completed vaccines  Qualifies for Shingles Vaccine? Yes   Zostavax completed No   Shingrix Completed?: No.    Education has been provided regarding the importance of this vaccine. Patient has been advised to call insurance company to determine out of pocket expense if they have not yet received this vaccine. Advised may also receive vaccine at local pharmacy or Health Dept. Verbalized acceptance and understanding.  Screening Tests Health Maintenance  Topic Date Due   Hepatitis C Screening  Never done   Zoster Vaccines- Shingrix (1 of 2) Never done   Pneumonia Vaccine 42+ Years old (3 - PPSV23 or PCV20) 12/13/2015   COVID-19 Vaccine (6 - Pfizer risk series) 11/09/2021   INFLUENZA VACCINE  07/09/2022   MAMMOGRAM  07/08/2024   TETANUS/TDAP  02/10/2026   COLONOSCOPY (Pts  45-81yrs Insurance  coverage will need to be confirmed)  05/15/2026   DEXA SCAN  Completed   HPV VACCINES  Aged Out    Health Maintenance  Health Maintenance Due  Topic Date Due   Hepatitis C Screening  Never done   Zoster Vaccines- Shingrix (1 of 2) Never done   Pneumonia Vaccine 59+ Years old (3 - PPSV23 or PCV20) 12/13/2015   COVID-19 Vaccine (6 - Pfizer risk series) 11/09/2021   INFLUENZA VACCINE  07/09/2022    Colorectal cancer screening: No longer required.   Mammogram status: No longer required due to age .  Bone Density status: Ordered 08/13/2022. Pt provided with contact info and advised to call to schedule appt.  Lung Cancer Screening: (Low Dose CT Chest recommended if Age 29-80 years, 30 pack-year currently smoking OR have quit w/in 15years.) does not qualify.   Lung Cancer Screening Referral: n/a  Additional Screening:  Hepatitis C Screening: does not qualify;   Vision Screening: Recommended annual ophthalmology exams for early detection of glaucoma and other disorders of the eye. Is the patient up to date with their annual eye exam?  Yes  Who is the provider or what is the name of the office in which the patient attends annual eye exams? Dr.Forsee If pt is not established with a provider, would they like to be referred to a provider to establish care? No .   Dental Screening: Recommended annual dental exams for proper oral hygiene  Community Resource Referral / Chronic Care Management: CRR required this visit?  No   CCM required this visit?  No      Plan:     I have personally reviewed and noted the following in the patient's chart:   Medical and social history Use of alcohol, tobacco or illicit drugs  Current medications and supplements including opioid prescriptions. Patient is not currently taking opioid prescriptions. Functional ability and status Nutritional status Physical activity Advanced directives List of other  physicians Hospitalizations, surgeries, and ER visits in previous 12 months Vitals Screenings to include cognitive, depression, and falls Referrals and appointments  In addition, I have reviewed and discussed with patient certain preventive protocols, quality metrics, and best practice recommendations. A written personalized care plan for preventive services as well as general preventive health recommendations were provided to patient.     Lorrene Reid, LPN   08/12/8545   Nurse Notes: none

## 2022-08-13 NOTE — Patient Instructions (Signed)
Kayla Sampson , Thank you for taking time to come for your Medicare Wellness Visit. I appreciate your ongoing commitment to your health goals. Please review the following plan we discussed and let me know if I can assist you in the future.   Screening recommendations/referrals: Colonoscopy: no longer required  Mammogram: no longer required  Bone Density: referral 08/13/2022 Recommended yearly ophthalmology/optometry visit for glaucoma screening and checkup Recommended yearly dental visit for hygiene and checkup  Vaccinations: Influenza vaccine: due in fall 2023  Pneumococcal vaccine: completed  Tdap vaccine: 02/11/2016 Shingles vaccine: will consider advised to consult with PCP    Covid-19:completed   Advanced directives: Please bring a copy of your health care power of attorney and living will to the office to be added to your chart at your convenience.   Conditions/risks identified: Aim for 30 minutes of exercise or brisk walking, 6-8 glasses of water, and 5 servings of fruits and vegetables each day.   Next appointment: Follow up in one year for your annual wellness visit    Preventive Care 65 Years and Older, Female Preventive care refers to lifestyle choices and visits with your health care provider that can promote health and wellness. What does preventive care include? A yearly physical exam. This is also called an annual well check. Dental exams once or twice a year. Routine eye exams. Ask your health care provider how often you should have your eyes checked. Personal lifestyle choices, including: Daily care of your teeth and gums. Regular physical activity. Eating a healthy diet. Avoiding tobacco and drug use. Limiting alcohol use. Practicing safe sex. Taking low-dose aspirin every day. Taking vitamin and mineral supplements as recommended by your health care provider. What happens during an annual well check? The services and screenings done by your health care provider  during your annual well check will depend on your age, overall health, lifestyle risk factors, and family history of disease. Counseling  Your health care provider may ask you questions about your: Alcohol use. Tobacco use. Drug use. Emotional well-being. Home and relationship well-being. Sexual activity. Eating habits. History of falls. Memory and ability to understand (cognition). Work and work Astronomer. Reproductive health. Screening  You may have the following tests or measurements: Height, weight, and BMI. Blood pressure. Lipid and cholesterol levels. These may be checked every 5 years, or more frequently if you are over 21 years old. Skin check. Lung cancer screening. You may have this screening every year starting at age 24 if you have a 30-pack-year history of smoking and currently smoke or have quit within the past 15 years. Fecal occult blood test (FOBT) of the stool. You may have this test every year starting at age 77. Flexible sigmoidoscopy or colonoscopy. You may have a sigmoidoscopy every 5 years or a colonoscopy every 10 years starting at age 74. Hepatitis C blood test. Hepatitis B blood test. Sexually transmitted disease (STD) testing. Diabetes screening. This is done by checking your blood sugar (glucose) after you have not eaten for a while (fasting). You may have this done every 1-3 years. Bone density scan. This is done to screen for osteoporosis. You may have this done starting at age 78. Mammogram. This may be done every 1-2 years. Talk to your health care provider about how often you should have regular mammograms. Talk with your health care provider about your test results, treatment options, and if necessary, the need for more tests. Vaccines  Your health care provider may recommend certain vaccines, such as:  Influenza vaccine. This is recommended every year. Tetanus, diphtheria, and acellular pertussis (Tdap, Td) vaccine. You may need a Td booster every  10 years. Zoster vaccine. You may need this after age 69. Pneumococcal 13-valent conjugate (PCV13) vaccine. One dose is recommended after age 36. Pneumococcal polysaccharide (PPSV23) vaccine. One dose is recommended after age 62. Talk to your health care provider about which screenings and vaccines you need and how often you need them. This information is not intended to replace advice given to you by your health care provider. Make sure you discuss any questions you have with your health care provider. Document Released: 12/22/2015 Document Revised: 08/14/2016 Document Reviewed: 09/26/2015 Elsevier Interactive Patient Education  2017 [place] Prevention in the Home Falls can cause injuries. They can happen to people of all ages. There are many things you can do to make your home safe and to help prevent falls. What can I do on the outside of my home? Regularly fix the edges of walkways and driveways and fix any cracks. Remove anything that might make you trip as you walk through a door, such as a raised step or threshold. Trim any bushes or trees on the path to your home. Use bright outdoor lighting. Clear any walking paths of anything that might make someone trip, such as rocks or tools. Regularly check to see if handrails are loose or broken. Make sure that both sides of any steps have handrails. Any raised decks and porches should have guardrails on the edges. Have any leaves, snow, or ice cleared regularly. Use sand or salt on walking paths during winter. Clean up any spills in your garage right away. This includes oil or grease spills. What can I do in the bathroom? Use night lights. Install grab bars by the toilet and in the tub and shower. Do not use towel bars as grab bars. Use non-skid mats or decals in the tub or shower. If you need to sit down in the shower, use a plastic, non-slip stool. Keep the floor dry. Clean up any water that spills on the floor as soon as it  happens. Remove soap buildup in the tub or shower regularly. Attach bath mats securely with double-sided non-slip rug tape. Do not have throw rugs and other things on the floor that can make you trip. What can I do in the bedroom? Use night lights. Make sure that you have a light by your bed that is easy to reach. Do not use any sheets or blankets that are too big for your bed. They should not hang down onto the floor. Have a firm chair that has side arms. You can use this for support while you get dressed. Do not have throw rugs and other things on the floor that can make you trip. What can I do in the kitchen? Clean up any spills right away. Avoid walking on wet floors. Keep items that you use a lot in easy-to-reach places. If you need to reach something above you, use a strong step stool that has a grab bar. Keep electrical cords out of the way. Do not use floor polish or wax that makes floors slippery. If you must use wax, use non-skid floor wax. Do not have throw rugs and other things on the floor that can make you trip. What can I do with my stairs? Do not leave any items on the stairs. Make sure that there are handrails on both sides of the stairs and use them.  Fix handrails that are broken or loose. Make sure that handrails are as long as the stairways. Check any carpeting to make sure that it is firmly attached to the stairs. Fix any carpet that is loose or worn. Avoid having throw rugs at the top or bottom of the stairs. If you do have throw rugs, attach them to the floor with carpet tape. Make sure that you have a light switch at the top of the stairs and the bottom of the stairs. If you do not have them, ask someone to add them for you. What else can I do to help prevent falls? Wear shoes that: Do not have high heels. Have rubber bottoms. Are comfortable and fit you well. Are closed at the toe. Do not wear sandals. If you use a stepladder: Make sure that it is fully opened.  Do not climb a closed stepladder. Make sure that both sides of the stepladder are locked into place. Ask someone to hold it for you, if possible. Clearly mark and make sure that you can see: Any grab bars or handrails. First and last steps. Where the edge of each step is. Use tools that help you move around (mobility aids) if they are needed. These include: Canes. Walkers. Scooters. Crutches. Turn on the lights when you go into a dark area. Replace any light bulbs as soon as they burn out. Set up your furniture so you have a clear path. Avoid moving your furniture around. If any of your floors are uneven, fix them. If there are any pets around you, be aware of where they are. Review your medicines with your doctor. Some medicines can make you feel dizzy. This can increase your chance of falling. Ask your doctor what other things that you can do to help prevent falls. This information is not intended to replace advice given to you by your health care provider. Make sure you discuss any questions you have with your health care provider. Document Released: 09/21/2009 Document Revised: 05/02/2016 Document Reviewed: 12/30/2014 Elsevier Interactive Patient Education  2017 [employer].

## 2022-08-27 ENCOUNTER — Other Ambulatory Visit: Payer: Self-pay | Admitting: Family Medicine

## 2022-08-27 DIAGNOSIS — Z780 Asymptomatic menopausal state: Secondary | ICD-10-CM

## 2022-09-01 ENCOUNTER — Other Ambulatory Visit: Payer: Self-pay | Admitting: Family Medicine

## 2022-09-02 NOTE — Telephone Encounter (Signed)
Chart supports rx. Last OV: 08/13/2022

## 2022-09-09 ENCOUNTER — Telehealth: Payer: Self-pay | Admitting: Family Medicine

## 2022-09-09 NOTE — Telephone Encounter (Signed)
Caller Name: Icy Fuhrmann Ph #: [phone] Chief Complaint: weakness, dizziness, sob   This call was transferred to Nurse Triage/Access Nurse. This is for documentation purposes. No follow up required at this time.

## 2022-09-17 DIAGNOSIS — M3501 Sicca syndrome with keratoconjunctivitis: Secondary | ICD-10-CM | POA: Diagnosis not present

## 2022-09-17 DIAGNOSIS — H18523 Epithelial (juvenile) corneal dystrophy, bilateral: Secondary | ICD-10-CM | POA: Diagnosis not present

## 2022-09-17 DIAGNOSIS — H35363 Drusen (degenerative) of macula, bilateral: Secondary | ICD-10-CM | POA: Diagnosis not present

## 2022-09-17 DIAGNOSIS — H2513 Age-related nuclear cataract, bilateral: Secondary | ICD-10-CM | POA: Diagnosis not present

## 2022-09-17 DIAGNOSIS — H52203 Unspecified astigmatism, bilateral: Secondary | ICD-10-CM | POA: Diagnosis not present

## 2022-09-17 DIAGNOSIS — H53021 Refractive amblyopia, right eye: Secondary | ICD-10-CM | POA: Diagnosis not present

## 2022-09-17 DIAGNOSIS — H353132 Nonexudative age-related macular degeneration, bilateral, intermediate dry stage: Secondary | ICD-10-CM | POA: Diagnosis not present

## 2022-09-17 DIAGNOSIS — H524 Presbyopia: Secondary | ICD-10-CM | POA: Diagnosis not present

## 2022-10-22 DIAGNOSIS — S20359A Superficial foreign body of unspecified front wall of thorax, initial encounter: Secondary | ICD-10-CM | POA: Diagnosis not present

## 2022-10-25 ENCOUNTER — Telehealth: Payer: Self-pay | Admitting: Family Medicine

## 2022-10-25 DIAGNOSIS — I10 Essential (primary) hypertension: Secondary | ICD-10-CM

## 2022-10-25 DIAGNOSIS — G250 Essential tremor: Secondary | ICD-10-CM

## 2022-10-25 MED ORDER — METOPROLOL SUCCINATE ER 25 MG PO TB24: ORAL_TABLET | ORAL | 0 refills | Status: DC

## 2022-10-25 NOTE — Telephone Encounter (Signed)
Caller Name: pt Call back phone #:  [phone]    MEDICATION(S):  metoprolol succinate (TOPROL-XL) 25 MG 24 hr tablet [[id number]]    Days of Med Remaining: 4 pills left  Has the patient contacted their pharmacy (YES/NO)? Yes contact your pharmacy   Preferred Pharmacy: [id number] [place], [place] - [address] AT [facility] [address] & [address] [place] [address] Phone: [phone]  Fax: [phone] DEA #: [id number]

## 2022-10-25 NOTE — Telephone Encounter (Signed)
Chart supports rx. Last OV: 07/08/2022 Next OV: 01/09/2023   Spoke with patient and advised her of refill. Patient scheduled on 01/09/2023 at 10:40am  with PCP for 6 month f/u.

## 2022-11-26 DIAGNOSIS — N3941 Urge incontinence: Secondary | ICD-10-CM | POA: Diagnosis not present

## 2022-11-26 DIAGNOSIS — N390 Urinary tract infection, site not specified: Secondary | ICD-10-CM | POA: Diagnosis not present

## 2022-11-26 DIAGNOSIS — N200 Calculus of kidney: Secondary | ICD-10-CM | POA: Diagnosis not present

## 2022-12-03 ENCOUNTER — Other Ambulatory Visit: Payer: Self-pay

## 2022-12-03 MED ORDER — AMLODIPINE-OLMESARTAN 10-20 MG PO TABS: 1.0000 | ORAL_TABLET | Freq: Every day | ORAL | 0 refills | Status: DC

## 2022-12-03 NOTE — Telephone Encounter (Signed)
Chart supports rx. Last OV: [id number] Next OV: [id number]  

## 2022-12-05 DIAGNOSIS — N2889 Other specified disorders of kidney and ureter: Secondary | ICD-10-CM | POA: Diagnosis not present

## 2022-12-05 DIAGNOSIS — N200 Calculus of kidney: Secondary | ICD-10-CM | POA: Diagnosis not present

## 2022-12-06 DIAGNOSIS — M3501 Sicca syndrome with keratoconjunctivitis: Secondary | ICD-10-CM | POA: Diagnosis not present

## 2022-12-06 DIAGNOSIS — Z79899 Other long term (current) drug therapy: Secondary | ICD-10-CM | POA: Diagnosis not present

## 2022-12-06 DIAGNOSIS — M1990 Unspecified osteoarthritis, unspecified site: Secondary | ICD-10-CM | POA: Diagnosis not present

## 2022-12-06 DIAGNOSIS — M2550 Pain in unspecified joint: Secondary | ICD-10-CM | POA: Diagnosis not present

## 2022-12-11 ENCOUNTER — Telehealth: Payer: Self-pay

## 2022-12-11 NOTE — Telephone Encounter (Signed)
LVM for pt to call the office to get scheduled for Mobile Mammo Unit. 

## 2022-12-16 DIAGNOSIS — Z87442 Personal history of urinary calculi: Secondary | ICD-10-CM | POA: Diagnosis not present

## 2022-12-16 DIAGNOSIS — N200 Calculus of kidney: Secondary | ICD-10-CM | POA: Diagnosis not present

## 2023-01-02 DIAGNOSIS — N200 Calculus of kidney: Secondary | ICD-10-CM | POA: Diagnosis not present

## 2023-01-05 ENCOUNTER — Other Ambulatory Visit: Payer: Self-pay | Admitting: Family Medicine

## 2023-01-05 DIAGNOSIS — G250 Essential tremor: Secondary | ICD-10-CM

## 2023-01-05 DIAGNOSIS — I10 Essential (primary) hypertension: Secondary | ICD-10-CM

## 2023-01-06 NOTE — Telephone Encounter (Signed)
Chart supports rx. Last OV: [id number] Next OV: [id number]

## 2023-01-09 ENCOUNTER — Ambulatory Visit: Payer: Medicare PPO | Admitting: Family Medicine

## 2023-01-09 ENCOUNTER — Encounter: Payer: Self-pay | Admitting: Family Medicine

## 2023-01-09 VITALS — BP 136/84 | HR 77 | Temp 97.4°F | Wt 186.8 lb

## 2023-01-09 DIAGNOSIS — I517 Cardiomegaly: Secondary | ICD-10-CM | POA: Diagnosis not present

## 2023-01-09 DIAGNOSIS — M3509 Sicca syndrome with other organ involvement: Secondary | ICD-10-CM | POA: Diagnosis not present

## 2023-01-09 DIAGNOSIS — I10 Essential (primary) hypertension: Secondary | ICD-10-CM | POA: Diagnosis not present

## 2023-01-09 DIAGNOSIS — I700 Atherosclerosis of aorta: Secondary | ICD-10-CM | POA: Diagnosis not present

## 2023-01-09 HISTORY — DX: Cardiomegaly: I51.7

## 2023-01-09 LAB — CBC WITH DIFFERENTIAL/PLATELET
Basophils Absolute: 0.1 10*3/uL (ref 0.0–0.1)
Basophils Relative: 1 % (ref 0.0–3.0)
Eosinophils Absolute: 0.1 10*3/uL (ref 0.0–0.7)
Eosinophils Relative: 1.3 % (ref 0.0–5.0)
HCT: 35.9 % — ABNORMAL LOW (ref 36.0–46.0)
Hemoglobin: 11.9 g/dL — ABNORMAL LOW (ref 12.0–15.0)
Lymphocytes Relative: 22.2 % (ref 12.0–46.0)
Lymphs Abs: 1.6 10*3/uL (ref 0.7–4.0)
MCHC: 33.1 g/dL (ref 30.0–36.0)
MCV: 92.1 fl (ref 78.0–100.0)
Monocytes Absolute: 0.5 10*3/uL (ref 0.1–1.0)
Monocytes Relative: 6.9 % (ref 3.0–12.0)
Neutro Abs: 5.1 10*3/uL (ref 1.4–7.7)
Neutrophils Relative %: 68.6 % (ref 43.0–77.0)
Platelets: 306 10*3/uL (ref 150.0–400.0)
RBC: 3.9 Mil/uL (ref 3.87–5.11)
RDW: 14.3 % (ref 11.5–15.5)
WBC: 7.4 10*3/uL (ref 4.0–10.5)

## 2023-01-09 LAB — COMPREHENSIVE METABOLIC PANEL
ALT: 18 U/L (ref 0–35)
AST: 22 U/L (ref 0–37)
Albumin: 3.9 g/dL (ref 3.5–5.2)
Alkaline Phosphatase: 77 U/L (ref 39–117)
BUN: 23 mg/dL (ref 6–23)
CO2: 24 mEq/L (ref 19–32)
Calcium: 9.1 mg/dL (ref 8.4–10.5)
Chloride: 106 mEq/L (ref 96–112)
Creatinine, Ser: 0.7 mg/dL (ref 0.40–1.20)
GFR: 85.11 mL/min (ref >60.00)
Glucose, Bld: 99 mg/dL (ref 70–99)
Potassium: 3.9 mEq/L (ref 3.5–5.1)
Sodium: 140 mEq/L (ref 135–145)
Total Bilirubin: 0.4 mg/dL (ref 0.2–1.2)
Total Protein: 6.7 g/dL (ref 6.0–8.3)

## 2023-01-09 LAB — URINALYSIS, ROUTINE W REFLEX MICROSCOPIC
Bilirubin Urine: NEGATIVE
Hgb urine dipstick: NEGATIVE
Ketones, ur: NEGATIVE
Leukocytes,Ua: NEGATIVE
Nitrite: NEGATIVE
Specific Gravity, Urine: >=1.03 — AB (ref 1.000–1.030)
Urine Glucose: NEGATIVE
Urobilinogen, UA: 0.2 (ref 0.0–1.0)
pH: 6 (ref 5.0–8.0)

## 2023-01-09 LAB — MICROALBUMIN / CREATININE URINE RATIO
Creatinine,U: 137.7 mg/dL
Microalb Creat Ratio: 1.8 mg/g (ref 0.0–30.0)
Microalb, Ur: 2.4 mg/dL — ABNORMAL HIGH (ref 0.0–1.9)

## 2023-01-09 LAB — LIPID PANEL
Cholesterol: 182 mg/dL (ref 0–200)
HDL: 58.7 mg/dL (ref >39.00)
LDL Cholesterol: 96 mg/dL (ref 0–99)
NonHDL: 123.17
Total CHOL/HDL Ratio: 3
Triglycerides: 135 mg/dL (ref 0.0–149.0)
VLDL: 27 mg/dL (ref 0.0–40.0)

## 2023-01-09 LAB — TSH: TSH: 2.22 u[IU]/mL (ref 0.35–5.50)

## 2023-01-09 LAB — HEMOGLOBIN A1C: Hgb A1c MFr Bld: 6.3 % (ref 4.6–6.5)

## 2023-01-09 MED ORDER — ROSUVASTATIN CALCIUM 10 MG PO TABS: 10.0000 mg | ORAL_TABLET | Freq: Every day | ORAL | 3 refills | Status: DC

## 2023-01-09 NOTE — Assessment & Plan Note (Signed)
The patient's CT indicated an enlarged heart and atherosclerotic plaque. Previously in 2018, she had cardiological evaluations that did not show significant abnormalities. Given her history and risk factors, there is concern for the progression of cardiac issues.  Differential diagnosis:  Hypertrophic cardiomyopathy Dilated cardiomyopathy Ischemic heart disease Valvular heart disease  Plan: The patient will be referred to cardiology for further evaluation including an echocardiogram and potentially a cardiac CT scan to assess plaque buildup and evaluate heart size and function more comprehensively. A low-dose statin (rosuvastatin 10 mg) will be initiated due to evidence of aortic plaque buildup to manage atherosclerosis and reduce the risk for heart disease.

## 2023-01-09 NOTE — Patient Instructions (Signed)
I am sorry about the loss of your friend. Please let us know how you are doing or if you need anything.

## 2023-01-09 NOTE — Progress Notes (Signed)
Assessment/Plan:   Problem List Items Addressed This Visit       Cardiovascular and Mediastinum   Primary hypertension   Relevant Medications   rosuvastatin (CRESTOR) 10 MG tablet   Other Relevant Orders   Ambulatory referral to Cardiology   TSH (Completed)   Lipid panel (Completed)   Hemoglobin A1c (Completed)   Microalbumin / creatinine urine ratio (Completed)   CBC with Differential/Platelet (Completed)   Comprehensive metabolic panel (Completed)   Urinalysis, Routine w reflex microscopic (Completed)   Cardiomegaly - Primary    The patient's CT indicated an enlarged heart and atherosclerotic plaque. Previously in 2018, she had cardiological evaluations that did not show significant abnormalities. Given her history and risk factors, there is concern for the progression of cardiac issues.  Differential diagnosis:  Hypertrophic cardiomyopathy Dilated cardiomyopathy Ischemic heart disease Valvular heart disease  Plan: The patient will be referred to cardiology for further evaluation including an echocardiogram and potentially a cardiac CT scan to assess plaque buildup and evaluate heart size and function more comprehensively. A low-dose statin (rosuvastatin 10 mg) will be initiated due to evidence of aortic plaque buildup to manage atherosclerosis and reduce the risk for heart disease.      Relevant Medications   rosuvastatin (CRESTOR) 10 MG tablet   Other Relevant Orders   Ambulatory referral to Cardiology   TSH (Completed)   Lipid panel (Completed)   Hemoglobin A1c (Completed)   Microalbumin / creatinine urine ratio (Completed)   CBC with Differential/Platelet (Completed)   Comprehensive metabolic panel (Completed)   Urinalysis, Routine w reflex microscopic (Completed)     Other   Sjogren syndrome with other organ involvement ([place])   Other Visit Diagnoses     Aortic atherosclerosis ([place])       Relevant Medications   rosuvastatin (CRESTOR) 10 MG tablet   Other  Relevant Orders   Ambulatory referral to Cardiology   TSH (Completed)   Lipid panel (Completed)   Hemoglobin A1c (Completed)   Microalbumin / creatinine urine ratio (Completed)   CBC with Differential/Platelet (Completed)   Comprehensive metabolic panel (Completed)   Urinalysis, Routine w reflex microscopic (Completed)       There are no discontinued medications.    Subjective:  HPI: Encounter date: 01/09/2023  Kayla Sampson is a 77 y.o. female who has Allergic rhinitis; Current mild episode of major depressive disorder without prior episode ([place]); GERD (gastroesophageal reflux disease); Inflammatory arthritis; Omphalitis in adult; Prediabetes; Age-related nuclear cataract, bilateral; Nephrolithiasis; Primary hypertension; Sjogren syndrome with other organ involvement ([place]); OAB (overactive bladder); Recurrent UTI; Hiatal hernia; Benign essential tremor syndrome; COVID-19; and Cardiomegaly on their problem list..   She  has a past medical history of Cervical stenosis of spine, Hiatal hernia, Hypertension, Kidney stone, Occasional tremors, Osteoarthritis, Sjoegren syndrome, and Trigeminal neuralgia pain.Marland Kitchen   CHIEF COMPLAINT: The patient presented for follow-up on high blood pressure and elevated blood sugars. She also mentioned concerns about a recent finding of an enlarged heart on a CT scan.  HISTORY OF PRESENT ILLNESS:  Problem 1: The patient has a history of high blood pressure and elevated blood sugar levels. She is currently being managed for these conditions and is here for follow-up. She reports recent urological evaluation where kidney stones were identified and treated, and incidental findings on CT scan of an enlarged heart and plaque buildup in the aorta.  Problem 2: The patient is experiencing grief following the loss of a pet, which has resulted in some mental health struggles.  However, she is not seeking medical treatment for this and is already engaged with a  therapist.  REVIEW OF SYSTEMS: Cardiovascular: Patient has a history of hypertension and is reporting an enlarged heart on the CT scan. Psychiatric: Experiencing grief but is engaged with therapeutic support. The remainder of the systems review is negative  Past Surgical History:  Procedure Laterality Date   ABDOMINAL HYSTERECTOMY     ANKLE FRACTURE SURGERY     ANTERIOR CERVICAL DECOMP/DISCECTOMY FUSION     THYROGLOSSAL DUCT CYST     TUBAL LIGATION      Outpatient Medications Prior to Visit  Medication Sig Dispense Refill   amlodipine-olmesartan (AZOR) 10-20 MG tablet Take 1 tablet by mouth daily. 90 tablet 0   Cholecalciferol (VITAMIN D-1000 MAX ST) 25 MCG (1000 UT) tablet Take by mouth.     DULoxetine (CYMBALTA) 60 MG capsule TAKE 1 CAPSULE(60 MG) BY MOUTH DAILY 90 capsule 3   Flaxseed, Linseed, (FLAXSEED OIL) 1200 MG CAPS Take by mouth.     folic acid (FOLVITE) 1 MG tablet Take 1 tablet by mouth daily.     hydroxychloroquine (PLAQUENIL) 200 MG tablet Take by mouth daily.     metFORMIN (GLUCOPHAGE-XR) 500 MG 24 hr tablet Take 1 tablet (500 mg total) by mouth daily. 90 tablet 2   methotrexate (RHEUMATREX) 2.5 MG tablet SMARTSIG:7 Tablet(s) By Mouth Once a Week     metoprolol succinate (TOPROL-XL) 25 MG 24 hr tablet TAKE 1 TABLET(25 MG) BY MOUTH DAILY 90 tablet 0   Multiple Vitamins-Minerals (CENTRUM SILVER 50+WOMEN PO) Take by mouth.     OVER THE COUNTER MEDICATION PreserVision Eye vitamin and mineral supplement     MYRBETRIQ 25 MG TB24 tablet Take 25 mg by mouth daily. (Patient not taking: Reported on 01/09/2023)     No facility-administered medications prior to visit.    No family history on file.  Social History   Socioeconomic History   Marital status: Married    Spouse name: Not on file   Number of children: Not on file   Years of education: Not on file   Highest education level: Not on file  Occupational History   Not on file  Tobacco Use   Smoking status: Never    Smokeless tobacco: Not on file  Vaping Use   Vaping Use: Never used  Substance and Sexual Activity   Alcohol use: No   Drug use: No   Sexual activity: Not on file  Other Topics Concern   Not on file  Social History Narrative   Not on file   Social Determinants of Health   Financial Resource Strain: Low Risk  (08/13/2022)   Overall Financial Resource Strain (CARDIA)    Difficulty of Paying Living Expenses: Not hard at all  Food Insecurity: No Food Insecurity (08/13/2022)   Hunger Vital Sign    Worried About Running Out of Food in the Last Year: Never true    [place] in the Last Year: Never true  Transportation Needs: No Transportation Needs (08/13/2022)   PRAPARE - Hydrologist (Medical): No    Lack of Transportation (Non-Medical): No  Physical Activity: Insufficiently Active (08/13/2022)   Exercise Vital Sign    Days of Exercise per Week: 2 days    Minutes of Exercise per Session: 10 min  Stress: No Stress Concern Present (08/13/2022)   [place]    Feeling of Stress : Not  at all  Social Connections: Moderately Isolated (08/13/2022)   Social Connection and Isolation Panel [NHANES]    Frequency of Communication with Friends and Family: More than three times a week    Frequency of Social Gatherings with Friends and Family: More than three times a week    Attends Religious Services: Never    Marine scientist or Organizations: No    Attends Archivist Meetings: Never    Marital Status: Married  Human resources officer Violence: Not At Risk (08/13/2022)   Humiliation, Afraid, Rape, and Kick questionnaire    Fear of Current or Ex-Partner: No    Emotionally Abused: No    Physically Abused: No    Sexually Abused: No                                                                                                 Objective:  Physical Exam: BP 136/84 (BP Location: Right Arm,  Patient Position: Sitting, Cuff Size: Large)   Pulse 77   Temp (!) 97.4 F (36.3 C) (Temporal)   Wt 186 lb 12.8 oz (84.7 kg)   SpO2 99%   BMI 33.09 kg/m    Gen: NAD, resting comfortably CV: RRR with no murmurs appreciated Pulm: NWOB, CTAB with no crackles, wheezes, or rhonchi GI: Normal bowel sounds present. Soft, Nontender, Nondistended. MSK: no edema, cyanosis, or clubbing noted Skin: warm, dry Neuro: grossly normal, moves all extremities Psych: Normal affect and thought content       Alesia Banda, MD, MS

## 2023-01-14 DIAGNOSIS — N200 Calculus of kidney: Secondary | ICD-10-CM | POA: Diagnosis not present

## 2023-01-15 DIAGNOSIS — L579 Skin changes due to chronic exposure to nonionizing radiation, unspecified: Secondary | ICD-10-CM | POA: Diagnosis not present

## 2023-01-15 DIAGNOSIS — L814 Other melanin hyperpigmentation: Secondary | ICD-10-CM | POA: Diagnosis not present

## 2023-01-15 DIAGNOSIS — D235 Other benign neoplasm of skin of trunk: Secondary | ICD-10-CM | POA: Diagnosis not present

## 2023-01-15 DIAGNOSIS — L821 Other seborrheic keratosis: Secondary | ICD-10-CM | POA: Diagnosis not present

## 2023-01-20 DIAGNOSIS — N200 Calculus of kidney: Secondary | ICD-10-CM | POA: Diagnosis not present

## 2023-01-29 ENCOUNTER — Other Ambulatory Visit: Payer: Self-pay

## 2023-01-29 DIAGNOSIS — N200 Calculus of kidney: Secondary | ICD-10-CM | POA: Insufficient documentation

## 2023-01-29 DIAGNOSIS — R918 Other nonspecific abnormal finding of lung field: Secondary | ICD-10-CM

## 2023-01-29 DIAGNOSIS — R251 Tremor, unspecified: Secondary | ICD-10-CM | POA: Insufficient documentation

## 2023-01-29 DIAGNOSIS — M4802 Spinal stenosis, cervical region: Secondary | ICD-10-CM | POA: Insufficient documentation

## 2023-01-29 DIAGNOSIS — M1990 Unspecified osteoarthritis, unspecified site: Secondary | ICD-10-CM | POA: Insufficient documentation

## 2023-01-29 DIAGNOSIS — K1120 Sialoadenitis, unspecified: Secondary | ICD-10-CM | POA: Insufficient documentation

## 2023-01-29 DIAGNOSIS — F32A Depression, unspecified: Secondary | ICD-10-CM

## 2023-01-29 DIAGNOSIS — E039 Hypothyroidism, unspecified: Secondary | ICD-10-CM

## 2023-01-29 DIAGNOSIS — I10 Essential (primary) hypertension: Secondary | ICD-10-CM | POA: Insufficient documentation

## 2023-01-29 DIAGNOSIS — R3129 Other microscopic hematuria: Secondary | ICD-10-CM

## 2023-01-29 DIAGNOSIS — G500 Trigeminal neuralgia: Secondary | ICD-10-CM | POA: Insufficient documentation

## 2023-01-29 DIAGNOSIS — R350 Frequency of micturition: Secondary | ICD-10-CM | POA: Insufficient documentation

## 2023-01-29 HISTORY — DX: Sialoadenitis, unspecified: K11.20

## 2023-01-29 HISTORY — DX: Other microscopic hematuria: R31.29

## 2023-01-29 HISTORY — DX: Depression, unspecified: F32.A

## 2023-01-29 HISTORY — DX: Frequency of micturition: R35.0

## 2023-01-29 HISTORY — DX: Other nonspecific abnormal finding of lung field: R91.8

## 2023-01-29 HISTORY — DX: Hypothyroidism, unspecified: E03.9

## 2023-01-31 DIAGNOSIS — N200 Calculus of kidney: Secondary | ICD-10-CM | POA: Diagnosis not present

## 2023-02-03 ENCOUNTER — Other Ambulatory Visit: Payer: Medicare PPO

## 2023-02-04 ENCOUNTER — Ambulatory Visit: Payer: Medicare PPO | Attending: Cardiology | Admitting: Cardiology

## 2023-02-04 ENCOUNTER — Encounter: Payer: Self-pay | Admitting: Cardiology

## 2023-02-04 VITALS — BP 126/68 | HR 58 | Ht 63.0 in | Wt 185.1 lb

## 2023-02-04 DIAGNOSIS — E088 Diabetes mellitus due to underlying condition with unspecified complications: Secondary | ICD-10-CM

## 2023-02-04 DIAGNOSIS — E782 Mixed hyperlipidemia: Secondary | ICD-10-CM | POA: Insufficient documentation

## 2023-02-04 DIAGNOSIS — I700 Atherosclerosis of aorta: Secondary | ICD-10-CM

## 2023-02-04 DIAGNOSIS — E669 Obesity, unspecified: Secondary | ICD-10-CM | POA: Diagnosis not present

## 2023-02-04 DIAGNOSIS — R0609 Other forms of dyspnea: Secondary | ICD-10-CM | POA: Diagnosis not present

## 2023-02-04 DIAGNOSIS — I10 Essential (primary) hypertension: Secondary | ICD-10-CM | POA: Diagnosis not present

## 2023-02-04 DIAGNOSIS — E66811 Obesity, class 1: Secondary | ICD-10-CM | POA: Insufficient documentation

## 2023-02-04 DIAGNOSIS — R011 Cardiac murmur, unspecified: Secondary | ICD-10-CM | POA: Diagnosis not present

## 2023-02-04 DIAGNOSIS — E119 Type 2 diabetes mellitus without complications: Secondary | ICD-10-CM

## 2023-02-04 HISTORY — DX: Obesity, class 1: E66.811

## 2023-02-04 HISTORY — DX: Cardiac murmur, unspecified: R01.1

## 2023-02-04 HISTORY — DX: Other forms of dyspnea: R06.09

## 2023-02-04 HISTORY — DX: Type 2 diabetes mellitus without complications: E11.9

## 2023-02-04 HISTORY — DX: Atherosclerosis of aorta: I70.0

## 2023-02-04 HISTORY — DX: Mixed hyperlipidemia: E78.2

## 2023-02-04 NOTE — Patient Instructions (Signed)
Medication Instructions:  Your physician recommends that you continue on your current medications as directed. Please refer to the Current Medication list given to you today.  *If you need a refill on your cardiac medications before your next appointment, please call your pharmacy*   Lab Work: None ordered If you have labs (blood work) drawn today and your tests are completely normal, you will receive your results only by: [place] (if you have MyChart) OR A paper copy in the mail If you have any lab test that is abnormal or we need to change your treatment, we will call you to review the results.   Testing/Procedures: You are scheduled for a Myocardial Perfusion Imaging Study.  Please arrive 15 minutes prior to your appointment time for registration and insurance purposes.  The test will take approximately 3 to 4 hours to complete; you may bring reading material.  If someone comes with you to your appointment, they will need to remain in the main lobby due to limited space in the testing area.   How to prepare for your Myocardial Perfusion Test: Do not eat or drink 3 hours prior to your test, except you may have water. Do not consume products containing caffeine (regular or decaffeinated) 12 hours prior to your test. (ex: coffee, chocolate, sodas, tea). Do bring a list of your current medications with you.  If not listed below, you may take your medications as normal. Do wear comfortable clothes (no dresses or overalls) and walking shoes, tennis shoes preferred (No heels or open toe shoes are allowed). Do NOT wear cologne, perfume, aftershave, or lotions (deodorant is allowed). If these instructions are not followed, your test will have to be rescheduled.  If you cannot keep your appointment, please provide 24 hours notification to the [facility], to avoid a possible $50 charge to your account.   Your physician has requested that you have an echocardiogram. Echocardiography is  a painless test that uses sound waves to create images of your heart. It provides your doctor with information about the size and shape of your heart and how well your heart's chambers and valves are working. This procedure takes approximately one hour. There are no restrictions for this procedure. Please do NOT wear cologne, perfume, aftershave, or lotions (deodorant is allowed). Please arrive 15 minutes prior to your appointment time.   Follow-Up: At [facility], you and your health needs are our priority.  As part of our continuing mission to provide you with exceptional heart care, we have created designated Provider Care Teams.  These Care Teams include your primary Cardiologist (physician) and Advanced Practice Providers (APPs -  Physician Assistants and Nurse Practitioners) who all work together to provide you with the care you need, when you need it.  We recommend signing up for the patient portal called "MyChart".  Sign up information is provided on this After Visit Summary.  MyChart is used to connect with patients for Virtual Visits (Telemedicine).  Patients are able to view lab/test results, encounter notes, upcoming appointments, etc.  Non-urgent messages can be sent to your provider as well.   To learn more about what you can do with MyChart, go to [email or web address].    Your next appointment:   6 month(s)  Provider:   Jyl Heinz, MD   Other Instructions  Cardiac Nuclear Scan A cardiac nuclear scan is a test that is done to check the flow of blood to your heart. It is done when you are resting and  when you are exercising. The test looks for problems such as: Not enough blood reaching a portion of the heart. The heart muscle not working as it should. You may need this test if you have: Heart disease. Lab results that are not normal. Had heart surgery or a balloon procedure to open up blocked arteries (angioplasty) or a small mesh tube (stent). Chest  pain. Shortness of breath. Had a heart attack. In this test, a special dye (tracer) is put into your bloodstream. The tracer will travel to your heart. A camera will then take pictures of your heart to see how the tracer moves through your heart. This test is usually done at a hospital and takes 2-4 hours. Tell a doctor about: Any allergies you have. All medicines you are taking, including vitamins, herbs, eye drops, creams, and over-the-counter medicines. Any bleeding problems you have. Any surgeries you have had. Any medical conditions you have. Whether you are pregnant or may be pregnant. Any history of asthma or long-term (chronic) lung disease. Any history of heart rhythm disorders or heart valve conditions. What are the risks? Your doctor will talk with you about risks. These may include: Serious chest pain and heart attack. This is only a risk if the stress portion of the test is done. Fast or uneven heartbeats (palpitations). A feeling of warmth in your chest. This feeling usually does not last long. Allergic reaction to the tracer. Shortness of breath or trouble breathing. What happens before the test? Ask your doctor about changing or stopping your normal medicines. Follow instructions from your doctor about what you cannot eat or drink. Remove your jewelry on the day of the test. Ask your doctor if you need to avoid nicotine or caffeine. What happens during the test? An IV tube will be inserted into one of your veins. Your doctor will give you a small amount of tracer through the IV tube. You will wait for 20-40 minutes while the tracer moves through your bloodstream. Your heart will be monitored with an electrocardiogram (ECG). You will lie down on an exam table. Pictures of your heart will be taken for about 15-20 minutes. You may also have a stress test. For this test, one of these things may be done: You will be asked to exercise on a treadmill or a stationary  bike. You will be given medicines that will make your heart work harder. This is done if you are unable to exercise. When blood flow to your heart has peaked, a tracer will again be given through the IV tube. After 20-40 minutes, you will get back on the exam table. More pictures will be taken of your heart. Depending on the tracer that is used, more pictures may need to be taken 3-4 hours later. Your IV tube will be removed when the test is over. The test may vary among doctors and hospitals. What happens after the test? Ask your doctor: Whether you can return to your normal schedule, including diet, activities, travel, and medicines. Whether you should drink more fluids. This will help to remove the tracer from your body. Ask your doctor, or the department that is doing the test: When will my results be ready? How will I get my results? What are my treatment options? What other tests do I need? What are my next steps? This information is not intended to replace advice given to you by your health care provider. Make sure you discuss any questions you have with your health care provider.  Document Revised: 04/23/2022 Document Reviewed: 04/23/2022 Elsevier Patient Education  [place].  Echocardiogram An echocardiogram is a test that uses sound waves (ultrasound) to produce images of the heart. Images from an echocardiogram can provide important information about: Heart size and shape. The size and thickness and movement of your heart's walls. Heart muscle function and strength. Heart valve function or if you have stenosis. Stenosis is when the heart valves are too narrow. If blood is flowing backward through the heart valves (regurgitation). A tumor or infectious growth around the heart valves. Areas of heart muscle that are not working well because of poor blood flow or injury from a heart attack. Aneurysm detection. An aneurysm is a weak or damaged part of an artery wall. The  wall bulges out from the normal force of blood pumping through the body. Tell a health care provider about: Any allergies you have. All medicines you are taking, including vitamins, herbs, eye drops, creams, and over-the-counter medicines. Any blood disorders you have. Any surgeries you have had. Any medical conditions you have. Whether you are pregnant or may be pregnant. What are the risks? Generally, this is a safe test. However, problems may occur, including an allergic reaction to dye (contrast) that may be used during the test. What happens before the test? No specific preparation is needed. You may eat and drink normally. What happens during the test?  You will take off your clothes from the waist up and put on a hospital gown. Electrodes or electrocardiogram (ECG)patches may be placed on your chest. The electrodes or patches are then connected to a device that monitors your heart rate and rhythm. You will lie down on a table for an ultrasound exam. A gel will be applied to your chest to help sound waves pass through your skin. A handheld device, called a transducer, will be pressed against your chest and moved over your heart. The transducer produces sound waves that travel to your heart and bounce back (or "echo" back) to the transducer. These sound waves will be captured in real-time and changed into images of your heart that can be viewed on a video monitor. The images will be recorded on a computer and reviewed by your health care provider. You may be asked to change positions or hold your breath for a short time. This makes it easier to get different views or better views of your heart. In some cases, you may receive contrast through an IV in one of your veins. This can improve the quality of the pictures from your heart. The procedure may vary among health care providers and hospitals. What can I expect after the test? You may return to your normal, everyday life, including diet,  activities, and medicines, unless your health care provider tells you not to do that. Follow these instructions at home: It is up to you to get the results of your test. Ask your health care provider, or the department that is doing the test, when your results will be ready. Keep all follow-up visits. This is important. Summary An echocardiogram is a test that uses sound waves (ultrasound) to produce images of the heart. Images from an echocardiogram can provide important information about the size and shape of your heart, heart muscle function, heart valve function, and other possible heart problems. You do not need to do anything to prepare before this test. You may eat and drink normally. After the echocardiogram is completed, you may return to your normal, everyday life,  unless your health care provider tells you not to do that. This information is not intended to replace advice given to you by your health care provider. Make sure you discuss any questions you have with your health care provider. Document Revised: 08/08/2021 Document Reviewed: 07/18/2020 Elsevier Patient Education  [place].

## 2023-02-04 NOTE — Progress Notes (Signed)
Cardiology Office Note:    Date:  02/04/2023   ID:  Kayla Sampson, DOB 1946-01-12, MRN [id number]  PCP:  Bonnita Hollow, MD  Cardiologist:  Jenean Lindau, MD   Referring MD: Bonnita Hollow, MD    ASSESSMENT:    1. Essential hypertension   2. Mixed dyslipidemia   3. Diabetes mellitus due to underlying condition with unspecified complications ([place])   4. Obesity (BMI 30.0-34.9)   5. Aortic atherosclerosis ([place])   6. Murmur, cardiac   7. Dyspnea on exertion   8. Cardiac murmur    PLAN:    In order of problems listed above:  Aortic atherosclerosis: Secondary prevention stressed with the patient.  Importance of compliance with diet medication stressed and she vocalized understanding.  I discussed these findings with her at length. Dyspnea on exertion: Patient leads a sedentary lifestyle.  This could be an anginal equivalent and we will do a Lexiscan sestamibi and patient is agreeable. Essential hypertension: Blood pressure stable and diet was emphasized. Diabetes mellitus and obesity: Lifestyle modification urged weight reduction stressed and she promises to do better. Mixed dyslipidemia: On lipid-lowering medications and followed by primary care.  Goal LDL must be less than 60.  Diet emphasized. Cardiac murmur: Echocardiogram will be done to assess murmur heard on auscultation. Patient will be seen in follow-up appointment in 6 months or earlier if the patient has any concerns    Medication Adjustments/Labs and Tests Ordered: Current medicines are reviewed at length with the patient today.  Concerns regarding medicines are outlined above.  Orders Placed This Encounter  Procedures   MYOCARDIAL PERFUSION IMAGING   EKG 12-Lead   ECHOCARDIOGRAM COMPLETE   No orders of the defined types were placed in this encounter.    History of Present Illness:    Kayla Sampson is a 77 y.o. female who is being seen today for the evaluation of dyspnea on exertion at the request of  Bonnita Hollow, MD. patient is a pleasant 77 year old female.  She has past medical history of essential hypertension dyslipidemia and diabetes mellitus.  She leads a sedentary lifestyle and ambulates with a [place]vc[place].  She has Sjogren's.  Patient denies any chest pain orthopnea or PND.  CT scan revealed aortic atherosclerosis and enlarged heart according to the report and therefore she is here for evaluation.  At the time of my evaluation, the patient is alert awake oriented and in no distress.  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   Benign essential tremor syndrome 05/31/2022   Cardiomegaly 01/09/2023   Cervical stenosis of spine    Class 1 obesity due to excess calories with serious comorbidity and body mass index (BMI) of 34.0 to 34.9 in adult 11/03/2021   Closed fracture of distal end of right radius 03/06/2016   Closed fracture of right wrist 03/06/2016   COVID-19 07/08/2022   Current mild episode of major depressive disorder without prior episode ([place]) 04/30/2017   Depression 01/29/2023   Drug therapy 02/13/2016   Dry eye syndrome, bilateral 02/28/2020   Early dry stage nonexudative age-related macular degeneration of both eyes 06/30/2017   Essential hypertension 03/06/2016   Fall at home 03/06/2016   Gait abnormality 02/16/2020   GERD (gastroesophageal reflux disease) 02/13/2016   Herpes zoster without complication [id number]   Hiatal hernia  High risk medication use 06/30/2017   Hypertension    Hypothyroidism 01/29/2023   Imbalance 04/08/2016   Increased urinary frequency 01/29/2023   Inflammatory arthritis 02/13/2016   Kidney stone    Laryngeal cyst 05/08/2022   Long-term use of Plaquenil 02/16/2020   Low back pain 05/20/2019   Lung nodule, multiple 01/29/2023    Microscopic hematuria 01/29/2023   Neck pain 04/08/2016   Nephrolithiasis 02/16/2020   Nondisplaced fracture of right ulna styloid process, initial encounter for closed fracture 03/06/2016   Normocytic anemia 03/06/2016   OAB (overactive bladder) 05/31/2022   Occasional tremors    Omphalitis in adult 02/13/2016   Osteoarthritis    Osteopenia 02/13/2016   Formatting of this note might be different from the original. actonel started 04/19/15   Prediabetes 05/17/2022   Presbyopia of both eyes 02/13/2016   Recurrent UTI 05/31/2022   Sialoadenitis 01/29/2023   Sjogren syndrome with other organ involvement ([place]) 05/31/2022   Sjogren's syndrome with keratoconjunctivitis sicca ([place]) 02/13/2016   SOB (shortness of breath) on exertion 04/01/2017   Spinal stenosis, cervical region 02/13/2016   Squamous 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 eyes 02/28/2020   Trigeminal neuralgia pain    Vitreous floater, bilateral 11/30/2018    Past Surgical History:  Procedure Laterality Date   ABDOMINAL HYSTERECTOMY     ANKLE FRACTURE SURGERY     ANTERIOR CERVICAL DECOMP/DISCECTOMY FUSION     THYROGLOSSAL DUCT CYST     TUBAL LIGATION      Current Medications: Current Meds  Medication Sig   amlodipine-olmesartan (AZOR) 10-20 MG tablet Take 1 tablet by mouth daily.   Cholecalciferol (VITAMIN D-1000 MAX ST) 25 MCG (1000 UT) tablet Take 1,000 Units by mouth daily.   DULoxetine (CYMBALTA) 60 MG capsule TAKE 1 CAPSULE(60 MG) BY MOUTH DAILY   Flaxseed, Linseed, (FLAXSEED OIL) 1200 MG CAPS Take 1,200 mg by mouth daily.   folic acid (FOLVITE) 1 MG tablet Take 1 tablet by mouth daily.   hydroxychloroquine (PLAQUENIL) 200 MG tablet Take 200 mg by mouth 2 (two) times daily.   metFORMIN (GLUCOPHAGE-XR) 500 MG 24 hr tablet Take 1 tablet (500 mg total) by mouth daily.   methotrexate (RHEUMATREX) 2.5 MG tablet Take 7 tablets by mouth once a week.   metoprolol succinate (TOPROL-XL) 25 MG 24 hr tablet TAKE 1  TABLET(25 MG) BY MOUTH DAILY   Multiple Vitamins-Minerals (PRESERVISION AREDS 2) CAPS Take 1 tablet by mouth 2 (two) times daily.   MYRBETRIQ 25 MG TB24 tablet Take 25 mg by mouth daily.   rosuvastatin (CRESTOR) 10 MG tablet Take 1 tablet (10 mg total) by mouth daily.     Allergies:   Carbamazepine, Lisinopril, and Sulfamethoxazole   Social History   Socioeconomic History   Marital status: Married    Spouse name: Not on file   Number of children: Not on file   Years of education: Not on file   Highest education level: Not on file  Occupational History   Not on file  Tobacco Use   Smoking status: Never   Smokeless tobacco: Not on file  Vaping Use   Vaping Use: Never used  Substance and Sexual Activity   Alcohol use: No   Drug use: No   Sexual activity: Not on file  Other Topics Concern   Not on file  Social History Narrative   Not on file   Social Determinants of Health   Financial Resource Strain: Low Risk  (08/13/2022)   Overall Financial  Resource Strain (CARDIA)    Difficulty of Paying Living Expenses: Not hard at all  Food Insecurity: No Food Insecurity (08/13/2022)   Hunger Vital Sign    Worried About Running Out of Food in the Last Year: Never true    Ran Out of Food in the Last Year: Never true  Transportation Needs: No Transportation Needs (08/13/2022)   PRAPARE - Hydrologist (Medical): No    Lack of Transportation (Non-Medical): No  Physical Activity: Insufficiently Active (08/13/2022)   Exercise Vital Sign    Days of Exercise per Week: 2 days    Minutes of Exercise per Session: 10 min  Stress: No Stress Concern Present (08/13/2022)   [place]    Feeling of Stress : Not at all  Social Connections: Moderately Isolated (08/13/2022)   Social Connection and Isolation Panel [NHANES]    Frequency of Communication with Friends and Family: More than three times a week     Frequency of Social Gatherings with Friends and Family: More than three times a week    Attends Religious Services: Never    Marine scientist or Organizations: No    Attends Music therapist: Never    Marital Status: Married     Family History: The patient's family history includes Cancer in her paternal grandfather; Diabetes in her father; Heart attack in her father; Hypertension in her father and mother.  ROS:   Please see the history of present illness.    All other systems reviewed and are negative.  EKGs/Labs/Other Studies Reviewed:    The following studies were reviewed today: EKG reveals sinus rhythm poor anterior forces and nonspecific ST changes   Recent Labs: 01/09/2023: ALT 18; BUN 23; Creatinine, Ser 0.70; Hemoglobin 11.9; Platelets 306.0; Potassium 3.9; Sodium 140; TSH 2.22  Recent Lipid Panel    Component Value Date/Time   CHOL 182 01/09/2023 1154   TRIG 135.0 01/09/2023 1154   HDL 58.70 01/09/2023 1154   CHOLHDL 3 01/09/2023 1154   VLDL 27.0 01/09/2023 1154   LDLCALC 96 01/09/2023 1154    Physical Exam:    VS:  BP 126/68   Pulse (!) 58   Ht '5\' 3"'$  (1.6 m)   Wt 185 lb 1.9 oz (84 kg)   SpO2 94%   BMI 32.79 kg/m     Wt Readings from Last 3 Encounters:  02/04/23 185 lb 1.9 oz (84 kg)  01/09/23 186 lb 12.8 oz (84.7 kg)  08/13/22 185 lb (83.9 kg)     GEN: Patient is in no acute distress HEENT: Normal NECK: No JVD; No carotid bruits LYMPHATICS: No lymphadenopathy CARDIAC: S1 S2 regular, 2/6 systolic murmur at the apex. RESPIRATORY:  Clear to auscultation without rales, wheezing or rhonchi  ABDOMEN: Soft, non-tender, non-distended MUSCULOSKELETAL:  No edema; No deformity  SKIN: Warm and dry NEUROLOGIC:  Alert and oriented x 3 PSYCHIATRIC:  Normal affect    Signed, Jenean Lindau, MD  02/04/2023 10:16 AM    [place]

## 2023-02-18 ENCOUNTER — Telehealth: Payer: Self-pay | Admitting: Family Medicine

## 2023-02-18 NOTE — Telephone Encounter (Signed)
Caller Name: Baani Angles Ph #: [phone] Chief Complaint: dizziness and exhaustion   This call was transferred to [facility] with Nurse Triage/Access Nurse. This is for documentation purposes. No follow up required at this time.

## 2023-02-24 NOTE — Telephone Encounter (Signed)
Patient given detailed instructions per Myocardial Perfusion Study Information Sheet for the test on 03/05/23 Patient notified to arrive 15 minutes early and that it is imperative to arrive on time for appointment to keep from having the test rescheduled.  If you need to cancel or reschedule your appointment, please call the office within 24 hours of your appointment. . Patient verbalized understanding. Kayla Sampson

## 2023-03-01 ENCOUNTER — Other Ambulatory Visit: Payer: Self-pay | Admitting: Family Medicine

## 2023-03-03 NOTE — Telephone Encounter (Signed)
Chart supports rx. Last OV: 01/09/2023 Next OV: [id number]

## 2023-03-05 ENCOUNTER — Ambulatory Visit (HOSPITAL_COMMUNITY): Payer: Medicare PPO | Attending: Cardiology

## 2023-03-05 ENCOUNTER — Ambulatory Visit (HOSPITAL_BASED_OUTPATIENT_CLINIC_OR_DEPARTMENT_OTHER): Payer: Medicare PPO

## 2023-03-05 DIAGNOSIS — R9431 Abnormal electrocardiogram [ECG] [EKG]: Secondary | ICD-10-CM | POA: Diagnosis not present

## 2023-03-05 DIAGNOSIS — I340 Nonrheumatic mitral (valve) insufficiency: Secondary | ICD-10-CM | POA: Diagnosis not present

## 2023-03-05 DIAGNOSIS — R0609 Other forms of dyspnea: Secondary | ICD-10-CM | POA: Diagnosis not present

## 2023-03-05 DIAGNOSIS — I700 Atherosclerosis of aorta: Secondary | ICD-10-CM | POA: Insufficient documentation

## 2023-03-05 DIAGNOSIS — R011 Cardiac murmur, unspecified: Secondary | ICD-10-CM

## 2023-03-05 DIAGNOSIS — I517 Cardiomegaly: Secondary | ICD-10-CM | POA: Diagnosis not present

## 2023-03-05 LAB — ECHOCARDIOGRAM COMPLETE
Area-P 1/2: 4.8 cm2
MV M vel: 5.27 m/s
MV Peak grad: 111.1 mmHg
S' Lateral: 2.8 cm

## 2023-03-05 LAB — MYOCARDIAL PERFUSION IMAGING
LV dias vol: 84 mL (ref 46–106)
LV sys vol: 31 mL
Nuc Stress EF: 63 %
Peak HR: 75 {beats}/min
Rest HR: 59 {beats}/min
Rest Nuclear Isotope Dose: 11 mCi
SDS: 1
SRS: 0
SSS: 1
ST Depression (mm): 0 mm
Stress Nuclear Isotope Dose: 31.1 mCi
TID: 0.96

## 2023-03-05 MED ORDER — TECHNETIUM TC 99M TETROFOSMIN IV KIT
11.0000 | PACK | Freq: Once | INTRAVENOUS | Status: AC | PRN
  Administered 2023-03-05: 11 via INTRAVENOUS

## 2023-03-05 MED ORDER — TECHNETIUM TC 99M TETROFOSMIN IV KIT
31.1000 | PACK | Freq: Once | INTRAVENOUS | Status: AC | PRN
  Administered 2023-03-05: 31.1 via INTRAVENOUS

## 2023-03-05 MED ORDER — REGADENOSON 0.4 MG/5ML IV SOLN
0.4000 mg | Freq: Once | INTRAVENOUS | Status: AC
  Administered 2023-03-05: 0.4 mg via INTRAVENOUS

## 2023-03-07 ENCOUNTER — Encounter: Payer: Self-pay | Admitting: Cardiology

## 2023-03-22 ENCOUNTER — Other Ambulatory Visit: Payer: Self-pay | Admitting: Family Medicine

## 2023-03-22 DIAGNOSIS — G250 Essential tremor: Secondary | ICD-10-CM

## 2023-03-22 DIAGNOSIS — I10 Essential (primary) hypertension: Secondary | ICD-10-CM

## 2023-03-24 DIAGNOSIS — Z79899 Other long term (current) drug therapy: Secondary | ICD-10-CM | POA: Diagnosis not present

## 2023-03-24 DIAGNOSIS — H35371 Puckering of macula, right eye: Secondary | ICD-10-CM | POA: Diagnosis not present

## 2023-03-24 DIAGNOSIS — M3501 Sicca syndrome with keratoconjunctivitis: Secondary | ICD-10-CM | POA: Diagnosis not present

## 2023-03-24 DIAGNOSIS — R7303 Prediabetes: Secondary | ICD-10-CM | POA: Diagnosis not present

## 2023-03-24 DIAGNOSIS — H18523 Epithelial (juvenile) corneal dystrophy, bilateral: Secondary | ICD-10-CM | POA: Diagnosis not present

## 2023-03-24 DIAGNOSIS — H43393 Other vitreous opacities, bilateral: Secondary | ICD-10-CM | POA: Diagnosis not present

## 2023-03-24 DIAGNOSIS — H2513 Age-related nuclear cataract, bilateral: Secondary | ICD-10-CM | POA: Diagnosis not present

## 2023-03-24 DIAGNOSIS — H35363 Drusen (degenerative) of macula, bilateral: Secondary | ICD-10-CM | POA: Diagnosis not present

## 2023-03-24 DIAGNOSIS — H353131 Nonexudative age-related macular degeneration, bilateral, early dry stage: Secondary | ICD-10-CM | POA: Diagnosis not present

## 2023-03-26 ENCOUNTER — Telehealth: Payer: Self-pay

## 2023-03-26 MED ORDER — DULOXETINE HCL 60 MG PO CPEP: ORAL_CAPSULE | ORAL | 3 refills | Status: DC

## 2023-03-26 NOTE — Telephone Encounter (Signed)
Chart supports rx. Last OV: 01/09/2023 Next OV: [id number]  

## 2023-03-27 DIAGNOSIS — M3501 Sicca syndrome with keratoconjunctivitis: Secondary | ICD-10-CM | POA: Diagnosis not present

## 2023-03-27 DIAGNOSIS — M2550 Pain in unspecified joint: Secondary | ICD-10-CM | POA: Diagnosis not present

## 2023-03-27 DIAGNOSIS — M1990 Unspecified osteoarthritis, unspecified site: Secondary | ICD-10-CM | POA: Diagnosis not present

## 2023-03-28 ENCOUNTER — Telehealth: Payer: Self-pay

## 2023-03-28 NOTE — Telephone Encounter (Signed)
A user error has taken place: encounter opened in error, closed for administrative reasons.

## 2023-04-09 ENCOUNTER — Ambulatory Visit: Payer: Medicare PPO | Admitting: Family Medicine

## 2023-04-10 ENCOUNTER — Encounter: Payer: Self-pay | Admitting: Family Medicine

## 2023-04-10 ENCOUNTER — Ambulatory Visit: Payer: Medicare PPO | Admitting: Family Medicine

## 2023-04-10 VITALS — BP 126/56 | HR 57 | Temp 98.1°F | Resp 18 | Ht 63.0 in | Wt 181.4 lb

## 2023-04-10 DIAGNOSIS — E782 Mixed hyperlipidemia: Secondary | ICD-10-CM | POA: Diagnosis not present

## 2023-04-10 DIAGNOSIS — R0602 Shortness of breath: Secondary | ICD-10-CM | POA: Diagnosis not present

## 2023-04-10 DIAGNOSIS — M25562 Pain in left knee: Secondary | ICD-10-CM

## 2023-04-10 DIAGNOSIS — G8929 Other chronic pain: Secondary | ICD-10-CM | POA: Diagnosis not present

## 2023-04-10 DIAGNOSIS — I700 Atherosclerosis of aorta: Secondary | ICD-10-CM

## 2023-04-10 DIAGNOSIS — I517 Cardiomegaly: Secondary | ICD-10-CM

## 2023-04-10 DIAGNOSIS — F4321 Adjustment disorder with depressed mood: Secondary | ICD-10-CM

## 2023-04-10 DIAGNOSIS — I10 Essential (primary) hypertension: Secondary | ICD-10-CM

## 2023-04-10 DIAGNOSIS — E119 Type 2 diabetes mellitus without complications: Secondary | ICD-10-CM

## 2023-04-10 DIAGNOSIS — E088 Diabetes mellitus due to underlying condition with unspecified complications: Secondary | ICD-10-CM

## 2023-04-10 LAB — LIPID PANEL
Cholesterol: 106 mg/dL (ref 0–200)
HDL: 61.9 mg/dL (ref >39.00)
LDL Cholesterol: 26 mg/dL (ref 0–99)
NonHDL: 43.99
Total CHOL/HDL Ratio: 2
Triglycerides: 88 mg/dL (ref 0.0–149.0)
VLDL: 17.6 mg/dL (ref 0.0–40.0)

## 2023-04-10 LAB — HEMOGLOBIN A1C: Hgb A1c MFr Bld: 6.6 % — ABNORMAL HIGH (ref 4.6–6.5)

## 2023-04-10 MED ORDER — DICLOFENAC SODIUM 1 % EX GEL
4.0000 g | Freq: Four times a day (QID) | CUTANEOUS | 3 refills | Status: AC | PRN
Start: 2023-04-10 — End: ?

## 2023-04-10 NOTE — Progress Notes (Addendum)
Assessment/Plan:   Problem List Items Addressed This Visit       Cardiovascular and Mediastinum   Essential hypertension - Primary   Cardiomegaly    Follows with cardiology.   Plan:  Continue low-dose statin (rosuvastatin 10 mg) for aortic plaque and cholesterol management. Follow-up with cardiology. Monitor and manage blood pressure with the continuation of antihypertensive therapy. Lab work which includes hemoglobin A1C and lipid panel to be rechecked.      Aortic atherosclerosis (HCC)     Endocrine   Diabetes mellitus due to underlying condition with unspecified complications (HCC)     Other   SOB (shortness of breath) on exertion   Mixed dyslipidemia   Relevant Orders   Lipid panel (Completed)   Grief    Follows with therapy.  Please continue to address mood as needed      Chronic pain of left knee    Prescribed Voltaren Gel 1% QID PRN and consider physical therapy or more imaging if the pain worsens.      Relevant Medications   diclofenac Sodium (VOLTAREN) 1 % GEL    There are no discontinued medications.  Return in about 6 months (around 10/11/2023) for BP, DM, HLD, fasting labs.    Subjective:   Encounter date: 04/10/2023  Kayla Sampson is a 77 y.o. female who has Allergic rhinitis; Current mild episode of major depressive disorder without prior episode (HCC); GERD (gastroesophageal reflux disease); Inflammatory arthritis; Omphalitis in adult; Prediabetes; Age-related nuclear cataract, bilateral; Nephrolithiasis; Essential hypertension; Sjogren syndrome with other organ involvement (HCC); OAB (overactive bladder); Recurrent UTI; Hiatal hernia; Benign essential tremor syndrome; COVID-19; Cardiomegaly; Cervical stenosis of spine; Hypertension; Kidney stone; Occasional tremors; Osteoarthritis; Trigeminal neuralgia pain; Actinic keratosis; Anterior basement membrane dystrophy; Class 1 obesity due to excess calories with serious comorbidity and body mass index  (BMI) of 34.0 to 34.9 in adult; Closed fracture of distal end of right radius; Closed fracture of right wrist; Depression; Drug therapy; Dry eye syndrome, bilateral; Early dry stage nonexudative age-related macular degeneration of both eyes; Fall at home; Gait abnormality; Herpes zoster without complication; High risk medication use; Hypothyroidism; Imbalance; Increased urinary frequency; Laryngeal cyst; Long-term use of Plaquenil; Low back pain; Lung nodule, multiple; Microscopic hematuria; Neck pain; Nondisplaced fracture of right ulna styloid process, initial encounter for closed fracture; Normocytic anemia; Osteopenia; Presbyopia of both eyes; SOB (shortness of breath) on exertion; Squamous 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   She  has a past medical history of Actinic keratosis (02/13/2016), Acute kidney injury (HCC) (05/05/2021), Age-related nuclear cataract, bilateral (02/13/2016), Allergic rhinitis (02/13/2016), Anterior basement membrane dystrophy (02/28/2020), Benign essential tremor syndrome (05/31/2022), Cardiomegaly (01/09/2023), Cervical stenosis of spine, Class 1 obesity due to excess calories with serious comorbidity and body mass index (BMI) of 34.0 to 34.9 in adult (11/03/2021), Closed fracture of distal end of right radius (03/06/2016), Closed fracture of right wrist (03/06/2016), COVID-19 (07/08/2022), Current mild episode of major depressive disorder without prior episode (HCC) (04/30/2017), Depression (01/29/2023), Drug therapy (02/13/2016), Dry eye syndrome,  bilateral (02/28/2020), Early dry stage nonexudative age-related macular degeneration of both eyes (06/30/2017), Essential hypertension (03/06/2016), Fall at home (03/06/2016), Gait abnormality (02/16/2020), GERD (gastroesophageal  reflux disease) (02/13/2016), Herpes zoster without complication (06/26/2021), Hiatal hernia, High risk medication use (06/30/2017), Hypertension, Hypothyroidism (01/29/2023), Imbalance (04/08/2016), Increased urinary frequency (01/29/2023), Inflammatory arthritis (02/13/2016), Kidney stone, Laryngeal cyst (05/08/2022), Long-term use of Plaquenil (02/16/2020), Low back pain (05/20/2019), Lung nodule, multiple (01/29/2023), Microscopic hematuria (01/29/2023), Neck pain (04/08/2016), Nephrolithiasis (02/16/2020), Nondisplaced fracture of right ulna styloid process, initial encounter for closed fracture (03/06/2016), Normocytic anemia (03/06/2016), OAB (overactive bladder) (05/31/2022), Occasional tremors, Omphalitis in adult (02/13/2016), Osteoarthritis, Osteopenia (02/13/2016), Prediabetes (05/17/2022), Presbyopia of both eyes (02/13/2016), Recurrent UTI (05/31/2022), Sialoadenitis (01/29/2023), Sjogren syndrome with other organ involvement (HCC) (05/31/2022), Sjogren's syndrome with keratoconjunctivitis sicca (HCC) (02/13/2016), SOB (shortness of breath) on exertion (04/01/2017), Spinal stenosis, cervical region (02/13/2016), Squamous 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  CHIEF COMPLAINT: The patient presented for follow-up on high blood pressure, elevated blood sugars, and concerns about a recent finding of an enlarged heart on a CT scan as well as chronic right knee pain.  HISTORY OF PRESENT ILLNESS:  High Blood Pressure: The patient has a history of high blood pressureand is here for a follow-up. Previously reported mild mitral valve regurgitation and on statin therapy with LDL  last checked at 96.  Elevated Blood Sugar Levels: Under management for elevated blood sugar levels with Metformin. Glucose at 119, hemoglobin A1C stable at 6.3.  Enlarged Heart: Incidental finding on CT of an enlarged heart and plaque buildup in the aorta.  Psychiatric: Patient experiencing grief following the loss of a pet. Engaged with a therapist for mental health support.  Rheumatology and Chronic Knee Pain: Continues on methotrexate and Plaquenil for inflammatory arthritis.   Ophthalmology: Cataract surgery planned for the upcoming summer.  Review of Systems  Constitutional:  Positive for malaise/fatigue.  Respiratory:  Positive for shortness of breath. Negative for wheezing.   Cardiovascular:  Negative for chest pain.    Past Surgical History:  Procedure Laterality Date   ABDOMINAL HYSTERECTOMY     ANKLE FRACTURE SURGERY     ANTERIOR CERVICAL DECOMP/DISCECTOMY FUSION     THYROGLOSSAL DUCT CYST     TUBAL LIGATION      Outpatient Medications Prior to Visit  Medication Sig Dispense Refill   amlodipine-olmesartan (AZOR) 10-20 MG tablet TAKE 1 TABLET BY MOUTH DAILY 90 tablet 0   Cholecalciferol (VITAMIN D-1000 MAX ST) 25 MCG (1000 UT) tablet Take 1,000 Units by mouth daily.     DULoxetine (CYMBALTA) 60 MG capsule TAKE 1 CAPSULE(60 MG) BY MOUTH DAILY 90 capsule 3   folic acid (FOLVITE) 1 MG tablet Take 1 tablet by mouth daily.     hydroxychloroquine (PLAQUENIL) 200 MG tablet Take 200 mg by mouth 2 (two) times daily.     metFORMIN (GLUCOPHAGE-XR) 500 MG 24 hr tablet TAKE 1 TABLET(500 MG) BY MOUTH DAILY 90 tablet 2   methotrexate (RHEUMATREX) 2.5 MG tablet Take 7 tablets by mouth once a week.     metoprolol succinate (TOPROL-XL) 25 MG 24 hr tablet TAKE 1 TABLET(25 MG) BY MOUTH DAILY 90 tablet 0   Multiple Vitamins-Minerals (PRESERVISION AREDS 2) CAPS Take 1 tablet by mouth 2 (two) times daily.     MYRBETRIQ 25 MG TB24 tablet Take 25 mg by mouth daily.     rosuvastatin (CRESTOR)  10 MG tablet Take 1 tablet (10 mg total) by mouth daily. 90 tablet 3   Flaxseed, Linseed, (FLAXSEED OIL) 1200 MG CAPS Take 1,200 mg by mouth daily. (Patient not taking: Reported  on 04/10/2023)     Multiple Vitamins-Minerals (CENTRUM SILVER 50+WOMEN PO) Take 1 tablet by mouth daily. (Patient not taking: Reported on 02/04/2023)     No facility-administered medications prior to visit.    Family History  Problem Relation Age of Onset   Hypertension Mother    Heart attack Father    Hypertension Father    Diabetes Father    Cancer Paternal Grandfather     Social History   Socioeconomic History   Marital status: Married    Spouse name: Not on file   Number of children: Not on file   Years of education: Not on file   Highest education level: 12th grade  Occupational History   Not on file  Tobacco Use   Smoking status: Never   Smokeless tobacco: Not on file  Vaping Use   Vaping Use: Never used  Substance and Sexual Activity   Alcohol use: No   Drug use: No   Sexual activity: Not on file  Other Topics Concern   Not on file  Social History Narrative   Not on file   Social Determinants of Health   Financial Resource Strain: Low Risk  (04/06/2023)   Overall Financial Resource Strain (CARDIA)    Difficulty of Paying Living Expenses: Not hard at all  Food Insecurity: No Food Insecurity (04/06/2023)   Hunger Vital Sign    Worried About Running Out of Food in the Last Year: Never true    Ran Out of Food in the Last Year: Never true  Transportation Needs: No Transportation Needs (04/06/2023)   PRAPARE - Administrator, Civil Service (Medical): No    Lack of Transportation (Non-Medical): No  Physical Activity: Inactive (04/06/2023)   Exercise Vital Sign    Days of Exercise per Week: 0 days    Minutes of Exercise per Session: 10 min  Stress: No Stress Concern Present (04/06/2023)   [employer] of Occupational Health - Occupational Stress Questionnaire    Feeling of  Stress : Only a little  Social Connections: Unknown (04/06/2023)   Social Connection and Isolation Panel [NHANES]    Frequency of Communication with Friends and Family: More than three times a week    Frequency of Social Gatherings with Friends and Family: Once a week    Attends Religious Services: Patient declined    Database administrator or Organizations: No    Attends Banker Meetings: Never    Marital Status: Married  Catering manager Violence: Not At Risk (08/13/2022)   Humiliation, Afraid, Rape, and Kick questionnaire    Fear of Current or Ex-Partner: No    Emotionally Abused: No    Physically Abused: No    Sexually Abused: No                                                                                                  Objective:  Physical Exam: BP (!) 126/56   Pulse (!) 57   Temp 98.1 F (36.7 C) (Oral)   Resp 18   Ht 5\' 3"  (1.6 m)   Wt  181 lb 6 oz (82.3 kg)   SpO2 98%   BMI 32.13 kg/m   Wt Readings from Last 3 Encounters:  04/10/23 181 lb 6 oz (82.3 kg)  02/04/23 185 lb 1.9 oz (84 kg)  01/09/23 186 lb 12.8 oz (84.7 kg)     Physical Exam Constitutional:      General: She is not in acute distress.    Appearance: Normal appearance. She is not ill-appearing or toxic-appearing.  HENT:     Head: Normocephalic and atraumatic.     Nose: Nose normal. No congestion.  Eyes:     General: No scleral icterus.    Extraocular Movements: Extraocular movements intact.  Cardiovascular:     Rate and Rhythm: Normal rate and regular rhythm.     Pulses: Normal pulses.     Heart sounds: Normal heart sounds.  Pulmonary:     Effort: Pulmonary effort is normal. No respiratory distress.     Breath sounds: Normal breath sounds.  Abdominal:     General: Abdomen is flat. Bowel sounds are normal.     Palpations: Abdomen is soft.  Musculoskeletal:        General: Normal range of motion.     Right lower leg: Tenderness (immediately lateral to patella) present. No  swelling, deformity or lacerations. No edema.  Lymphadenopathy:     Cervical: No cervical adenopathy.  Skin:    General: Skin is warm and dry.     Findings: No rash.  Neurological:     General: No focal deficit present.     Mental Status: She is alert and oriented to person, place, and time. Mental status is at baseline.     Cranial Nerves: Cranial nerves 2-12 are intact.     Gait: Gait abnormal (ambulates with cane).  Psychiatric:        Mood and Affect: Mood normal.        Behavior: Behavior normal.        Thought Content: Thought content normal.        Judgment: Judgment normal.     ECHOCARDIOGRAM COMPLETE  Result Date: 03/05/2023    ECHOCARDIOGRAM REPORT   Patient Name:   SANVI OGUINN Date of Exam: 03/05/2023 Medical Rec #:  [id number]      Height:       63.0 in Accession #:    [id number]     Weight:       185.1 lb Date of Birth:  [birth year]       BSA:          1.871 m Patient Age:    76 years       BP:           126/68 mmHg Patient Gender: F              HR:           61 bpm. Exam Location:  [address] Procedure: 2D Echo, Cardiac Doppler and Color Doppler Indications:    R01.1 Murmur  History:        Patient has no prior history of Echocardiogram examinations.                 Cardiomegaly; Risk Factors:Hypertension, Diabetes and                 Dyslipidemia. Dyspnea on exertion. Aortic atherosclerosis.                 Obesity.  Sonographer:    Cathie Beams RCS  Referring Phys: [employer] IMPRESSIONS  1. Left ventricular ejection fraction, by estimation, is 65 to 70%. The left ventricle has normal function. The left ventricle has no regional wall motion abnormalities. There is mild concentric left ventricular hypertrophy. Left ventricular diastolic parameters are indeterminate.  2. Right ventricular systolic function is normal. The right ventricular size is normal. There is normal pulmonary artery systolic pressure.  3. Left atrial size was mild to moderately dilated.  4. The mitral  valve is normal in structure. Mild mitral valve regurgitation. No evidence of mitral stenosis.  5. The aortic valve is tricuspid. There is mild calcification of the aortic valve. Aortic valve regurgitation is not visualized. Aortic valve sclerosis is present, with no evidence of aortic valve stenosis.  6. The inferior vena cava is normal in size with greater than 50% respiratory variability, suggesting right atrial pressure of 3 mmHg. Comparison(s): No prior Echocardiogram. Conclusion(s)/Recommendation(s): Normal biventricular function without evidence of hemodynamically significant valvular heart disease. FINDINGS  Left Ventricle: Left ventricular ejection fraction, by estimation, is 65 to 70%. The left ventricle has normal function. The left ventricle has no regional wall motion abnormalities. The left ventricular internal cavity size was normal in size. There is  mild concentric left ventricular hypertrophy. Left ventricular diastolic parameters are indeterminate. Right Ventricle: The right ventricular size is normal. No increase in right ventricular wall thickness. Right ventricular systolic function is normal. There is normal pulmonary artery systolic pressure. The tricuspid regurgitant velocity is 1.69 m/s, and  with an assumed right atrial pressure of 3 mmHg, the estimated right ventricular systolic pressure is 14.4 mmHg. Left Atrium: Left atrial size was mild to moderately dilated. Right Atrium: Right atrial size was normal in size. Pericardium: There is no evidence of pericardial effusion. Mitral Valve: The mitral valve is normal in structure. Mild mitral valve regurgitation. No evidence of mitral valve stenosis. Tricuspid Valve: The tricuspid valve is normal in structure. Tricuspid valve regurgitation is trivial. No evidence of tricuspid stenosis. Aortic Valve: The aortic valve is tricuspid. There is mild calcification of the aortic valve. Aortic valve regurgitation is not visualized. Aortic valve  sclerosis is present, with no evidence of aortic valve stenosis. Pulmonic Valve: The pulmonic valve was not well visualized. Pulmonic valve regurgitation is not visualized. Aorta: The aortic root, ascending aorta, aortic arch and descending aorta are all structurally normal, with no evidence of dilitation or obstruction. Venous: The inferior vena cava is normal in size with greater than 50% respiratory variability, suggesting right atrial pressure of 3 mmHg. IAS/Shunts: The atrial septum is grossly normal.  LEFT VENTRICLE PLAX 2D LVIDd:         4.70 cm   Diastology LVIDs:         2.80 cm   LV e' medial:    9.11 cm/s LV PW:         1.20 cm   LV E/e' medial:  14.9 LV IVS:        1.17 cm   LV e' lateral:   12.40 cm/s LVOT diam:     2.00 cm   LV E/e' lateral: 11.0 LV SV:         77 LV SV Index:   41 LVOT Area:     3.14 cm  RIGHT VENTRICLE RV Basal diam:  2.70 cm RV Mid diam:    2.40 cm RV S prime:     10.90 cm/s TAPSE (M-mode): 1.9 cm RVSP:  14.4 mmHg LEFT ATRIUM             Index        RIGHT ATRIUM           Index LA diam:        4.20 cm 2.24 cm/m   RA Pressure: 3.00 mmHg LA Vol (A2C):   97.0 ml 51.84 ml/m  RA Area:     12.10 cm LA Vol (A4C):   50.3 ml 26.88 ml/m  RA Volume:   23.90 ml  12.77 ml/m LA Biplane Vol: 71.6 ml 38.27 ml/m  AORTIC VALVE LVOT Vmax:   88.50 cm/s LVOT Vmean:  62.500 cm/s LVOT VTI:    0.244 m  AORTA Ao Root diam: 2.50 cm Ao Asc diam:  2.90 cm MITRAL VALVE                TRICUSPID VALVE MV Area (PHT): 4.80 cm     TR Peak grad:   11.4 mmHg MV Decel Time: 158 msec     TR Vmax:        169.00 cm/s MR Peak grad: 111.1 mmHg    Estimated RAP:  3.00 mmHg MR Mean grad: 57.0 mmHg     RVSP:           14.4 mmHg MR Vmax:      527.00 cm/s MR Vmean:     364.0 cm/s    SHUNTS MV E velocity: 136.00 cm/s  Systemic VTI:  0.24 m MV A velocity: 97.10 cm/s   Systemic Diam: 2.00 cm MV E/A ratio:  1.40 Jodelle Red MD Electronically signed by Jodelle Red MD Signature Date/Time:  03/05/2023/2:34:08 PM    Final    MYOCARDIAL PERFUSION IMAGING  Result Date: 03/05/2023   The study is normal. The study is low risk.   No ST deviation was noted.   LV perfusion is normal.   Left ventricular function is normal. Nuclear stress EF: 63 %. The left ventricular ejection fraction is normal (55-65%). End diastolic cavity size is normal. End systolic cavity size is normal.    Recent Results (from the past 2160 hour(s))  ECHOCARDIOGRAM COMPLETE     Status: None   Collection Time: 03/05/23  9:58 AM  Result Value Ref Range   Area-P 1/2 4.80 cm2   S' Lateral 2.80 cm   MV M vel 5.27 m/s   MV Peak grad 111.1 mmHg   Est EF 65 - 70%   MYOCARDIAL PERFUSION IMAGING     Status: None   Collection Time: 03/05/23 10:43 AM  Result Value Ref Range   Rest Nuclear Isotope Dose 11.0 mCi   Stress Nuclear Isotope Dose 31.1 mCi   Rest HR 59.0 bpm   Rest BP 146/73 mmHg   Peak HR 75 bpm   Peak BP 146/57 mmHg   SSS 1.0    SRS 0.0    SDS 1.0    TID 0.96    LV sys vol 31.0 mL   LV dias vol 84.0 46 - 106 mL   Nuc Stress EF 63 %   ST Depression (mm) 0 mm  Lipid panel     Status: None   Collection Time: 04/10/23  9:41 AM  Result Value Ref Range   Cholesterol 106 0 - 200 mg/dL    Comment: ATP III Classification       Desirable:  < 200 mg/dL               Borderline High:  200 - 239 mg/dL          High:  > = 161 mg/dL   Triglycerides 09.6 0.0 - 149.0 mg/dL    Comment: Normal:  <045 mg/dLBorderline High:  150 - 199 mg/dL   HDL 40.98 >11.91 mg/dL   VLDL 47.8 0.0 - 29.5 mg/dL   LDL Cholesterol 26 0 - 99 mg/dL   Total CHOL/HDL Ratio 2     Comment:                Men          Women1/2 Average Risk     3.4          3.3Average Risk          5.0          4.42X Average Risk          9.6          7.13X Average Risk          15.0          11.0                       NonHDL 43.99     Comment: NOTE:  Non-HDL goal should be 30 mg/dL higher than patient's LDL goal (i.e. LDL goal of < 70 mg/dL, would have  non-HDL goal of < 100 mg/dL)  Hemoglobin A2Z     Status: Abnormal   Collection Time: 04/10/23  9:41 AM  Result Value Ref Range   Hgb A1c MFr Bld 6.6 (H) 4.6 - 6.5 %    Comment: Glycemic Control Guidelines for People with Diabetes:Non Diabetic:  <6%Goal of Therapy: <7%Additional Action Suggested:  >8%         Garner Nash, MD, MS

## 2023-04-15 DIAGNOSIS — G8929 Other chronic pain: Secondary | ICD-10-CM

## 2023-04-15 DIAGNOSIS — F4321 Adjustment disorder with depressed mood: Secondary | ICD-10-CM

## 2023-04-15 HISTORY — DX: Adjustment disorder with depressed mood: F43.21

## 2023-04-15 HISTORY — DX: Other chronic pain: G89.29

## 2023-04-15 NOTE — Assessment & Plan Note (Signed)
Follows with therapy.  Please continue to address mood as needed

## 2023-04-15 NOTE — Assessment & Plan Note (Signed)
Prescribed Voltaren Gel 1% QID PRN and consider physical therapy or more imaging if the pain worsens.

## 2023-04-15 NOTE — Assessment & Plan Note (Signed)
Follows with cardiology.   Plan:  Continue low-dose statin (rosuvastatin 10 mg) for aortic plaque and cholesterol management. Follow-up with cardiology. Monitor and manage blood pressure with the continuation of antihypertensive therapy. Lab work which includes hemoglobin A1C and lipid panel to be rechecked.

## 2023-05-09 ENCOUNTER — Encounter: Payer: Self-pay | Admitting: Nurse Practitioner

## 2023-05-09 ENCOUNTER — Ambulatory Visit (INDEPENDENT_AMBULATORY_CARE_PROVIDER_SITE_OTHER): Payer: Medicare PPO | Admitting: Nurse Practitioner

## 2023-05-09 VITALS — BP 126/70 | HR 57 | Temp 97.8°F | Ht 63.0 in | Wt 179.6 lb

## 2023-05-09 DIAGNOSIS — B029 Zoster without complications: Secondary | ICD-10-CM

## 2023-05-09 MED ORDER — VALACYCLOVIR HCL 1 G PO TABS: 1000.0000 mg | ORAL_TABLET | Freq: Three times a day (TID) | ORAL | 0 refills | Status: AC

## 2023-05-09 NOTE — Patient Instructions (Signed)
It was great to see you!  Start valtrex 1 tablet 3  times a day for 7 days.  You can take tylenol or use a lidocaine patch as needed for pain.   Let's follow-up if your symptoms worsen or don't improve   Take care,  Rodman Pickle, NP

## 2023-05-09 NOTE — Progress Notes (Signed)
Acute Office Visit  Subjective:     Patient ID: Kayla Sampson, female    DOB: 07-07-1946, 77 y.o.   MRN: [id number]  Chief Complaint  Patient presents with   Herpes Zoster    Right of neck with pain for 3 days   HPI Patient is in today with a complaint of pain of the right neck for 3 days along with rash. The patient rates the pain in neck 4 out of 10. She states the pain feels the same as her previous episodes of herpes zoster (shingles)  in past. The patient shares she has not taken anything for the pain but the pain is getting intolerable.   She states life stressors have increased secondary to caring for husband at home for the last 2 weeks as he recovers from having orthopaedic surgery.  Patient shares she has a history of Sjogren syndrome and she is immunocompromised however does not feel like this rash is associated with that. She thinks it is Shingles. Denies fever, sore throat, eyepain or any cardiopulmonary concerns.   Review of Systems  Constitutional:  Negative for chills and fever.  Respiratory:  Negative for cough, shortness of breath and wheezing.   Cardiovascular:  Negative for chest pain and palpitations.  Musculoskeletal:  Positive for neck pain.  Skin:  Positive for rash.  Neurological:  Positive for tingling.  Psychiatric/Behavioral: Negative.        Objective:    BP 126/70 (BP Location: Left Arm)   Pulse (!) 57   Temp 97.8 F (36.6 C)   Ht 5\' 3"  (1.6 m)   Wt 179 lb 9.6 oz (81.5 kg)   SpO2 98%   BMI 31.81 kg/m    Physical Exam Constitutional:      Appearance: Normal appearance.  HENT:     Head: Normocephalic and atraumatic.  Neck:     Comments: Tenderness right neck  Cardiovascular:     Rate and Rhythm: Normal rate and regular rhythm.     Pulses: Normal pulses.     Heart sounds: Normal heart sounds. No murmur heard.    No friction rub. No gallop.  Pulmonary:     Effort: Pulmonary effort is normal. No respiratory distress.     Breath sounds:  Normal breath sounds. No wheezing.  Musculoskeletal:     Cervical back: Tenderness present.     Comments: Tenderness right neck   Skin:    Findings: Erythema and rash present.     Comments: Right neck rash with vesicles  Neurological:     Mental Status: She is alert and oriented to person, place, and time.  Psychiatric:        Mood and Affect: Mood normal.        Behavior: Behavior normal.        Thought Content: Thought content normal.        Judgment: Judgment normal.    No results found for any visits on 05/09/23.    Assessment & Plan:   Problem List Items Addressed This Visit       Other   Herpes zoster without complication - Primary    Per physical assessment patient presentation is indicative of shingles. Prescribed valtrex 1000 mg three times a day for 7 days. Encourage to used lidocaine patches and tylenol for pain management. Discussed getting the Shingrex vaccine once this shingles episode has cleared. Instructed to return if symptoms worsen and or if pain becomes unmanageable.       Relevant  Medications   valACYclovir (VALTREX) 1000 MG tablet    Meds ordered this encounter  Medications   valACYclovir (VALTREX) 1000 MG tablet    Sig: Take 1 tablet (1,000 mg total) by mouth 3 (three) times daily for 7 days.    Dispense:  21 tablet    Refill:  0    Return if symptoms worsen or fail to improve.  Bishop Dublin, RN

## 2023-05-09 NOTE — Assessment & Plan Note (Addendum)
Per physical assessment patient presentation is indicative of shingles. Prescribed valtrex 1000 mg three times a day for 7 days. Encourage to used lidocaine patches and tylenol for pain management. Discussed getting the Shingrex vaccine once this shingles episode has cleared. Instructed to return if symptoms worsen and or if pain becomes unmanageable.

## 2023-05-29 ENCOUNTER — Other Ambulatory Visit: Payer: Self-pay | Admitting: Family Medicine

## 2023-06-20 ENCOUNTER — Other Ambulatory Visit: Payer: Self-pay | Admitting: Family Medicine

## 2023-06-20 DIAGNOSIS — G250 Essential tremor: Secondary | ICD-10-CM

## 2023-06-20 DIAGNOSIS — I10 Essential (primary) hypertension: Secondary | ICD-10-CM

## 2023-06-23 NOTE — Telephone Encounter (Signed)
Chart supports rx. Last OV: 05/09/2023 Next OV: 10/13/2023

## 2023-07-15 ENCOUNTER — Encounter: Payer: Self-pay | Admitting: Nurse Practitioner

## 2023-07-15 ENCOUNTER — Other Ambulatory Visit: Payer: Self-pay | Admitting: Family Medicine

## 2023-07-15 DIAGNOSIS — Z1231 Encounter for screening mammogram for malignant neoplasm of breast: Secondary | ICD-10-CM

## 2023-07-16 ENCOUNTER — Ambulatory Visit
Admission: RE | Admit: 2023-07-16 | Discharge: 2023-07-16 | Disposition: A | Discharge status: Home / Self Care | Payer: Medicare PPO | Source: Ambulatory Visit | Attending: Family Medicine | Admitting: Family Medicine

## 2023-07-16 DIAGNOSIS — Z1231 Encounter for screening mammogram for malignant neoplasm of breast: Secondary | ICD-10-CM | POA: Diagnosis not present

## 2023-07-21 DIAGNOSIS — Z79899 Other long term (current) drug therapy: Secondary | ICD-10-CM | POA: Diagnosis not present

## 2023-07-21 DIAGNOSIS — H43813 Vitreous degeneration, bilateral: Secondary | ICD-10-CM | POA: Diagnosis not present

## 2023-07-21 DIAGNOSIS — E119 Type 2 diabetes mellitus without complications: Secondary | ICD-10-CM | POA: Diagnosis not present

## 2023-07-21 DIAGNOSIS — H353131 Nonexudative age-related macular degeneration, bilateral, early dry stage: Secondary | ICD-10-CM | POA: Diagnosis not present

## 2023-07-21 DIAGNOSIS — H5213 Myopia, bilateral: Secondary | ICD-10-CM | POA: Diagnosis not present

## 2023-07-21 DIAGNOSIS — H53021 Refractive amblyopia, right eye: Secondary | ICD-10-CM | POA: Diagnosis not present

## 2023-07-21 DIAGNOSIS — M3501 Sicca syndrome with keratoconjunctivitis: Secondary | ICD-10-CM | POA: Diagnosis not present

## 2023-07-21 DIAGNOSIS — H2513 Age-related nuclear cataract, bilateral: Secondary | ICD-10-CM | POA: Diagnosis not present

## 2023-07-21 DIAGNOSIS — H35363 Drusen (degenerative) of macula, bilateral: Secondary | ICD-10-CM | POA: Diagnosis not present

## 2023-07-21 IMAGING — CT CT HEAD W/O CM
4 series · 16 of 47 positions shown, 18 images · non-contrast
Comparison: 11/01/2015.  06/24/2013.

CLINICAL DATA: Fall with trauma to the head, face and neck.



[Series 2: head wo · axial · 0.40mm/px · z∈[+828,+948]mm · 7 of 34 slices shown, 9 images]
[im 5/34  brain]
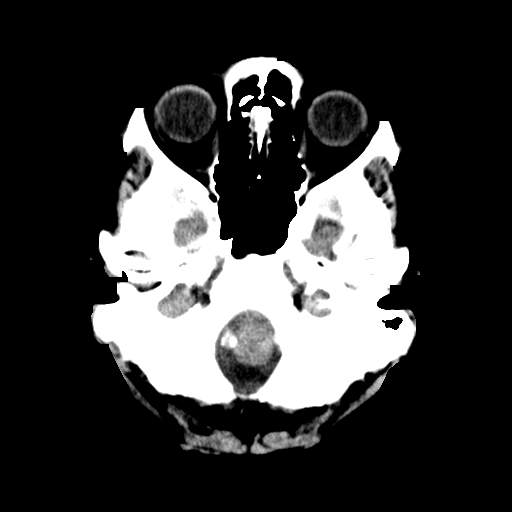
[im 5/34  bone]
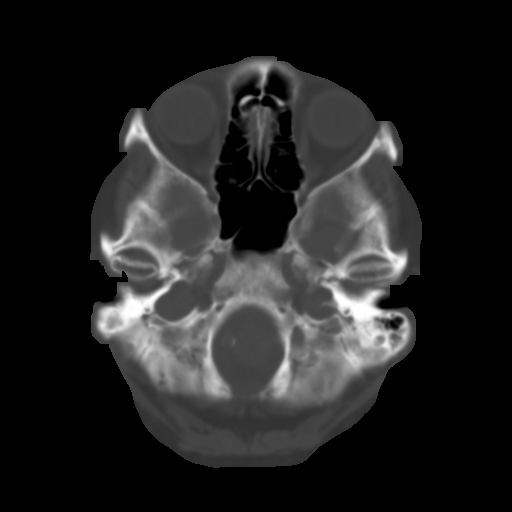
[im 9/34  brain]
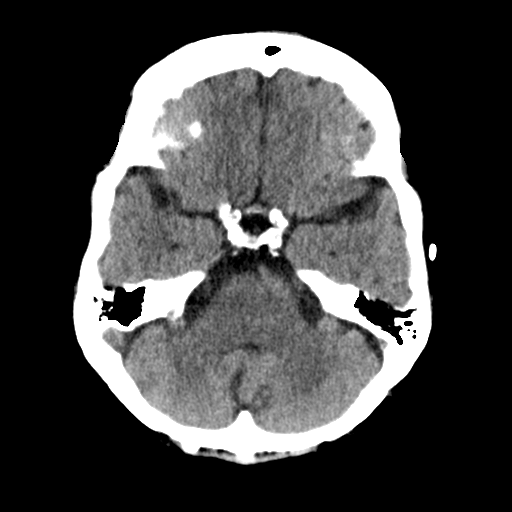
[im 13/34  brain]
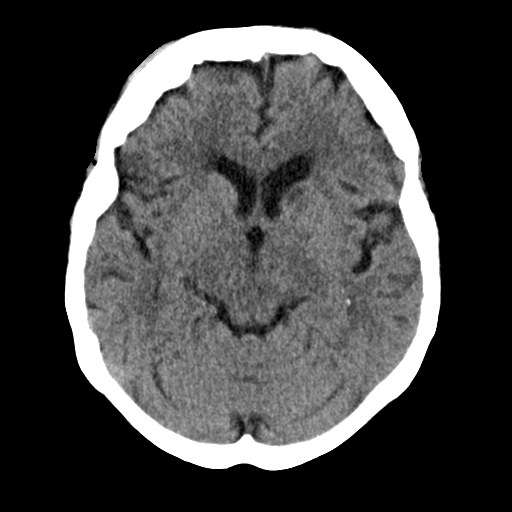
[im 17/34  brain]
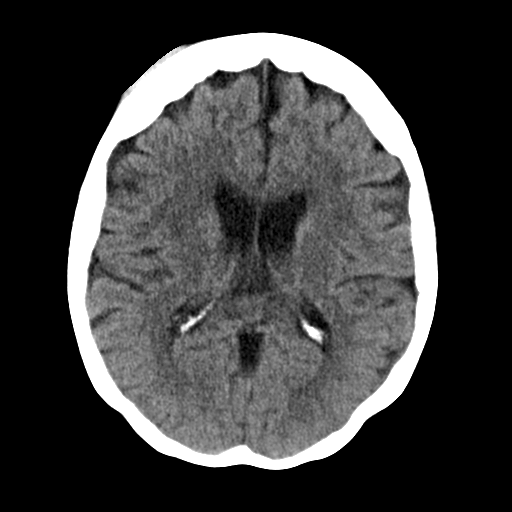
[im 21/34  brain]
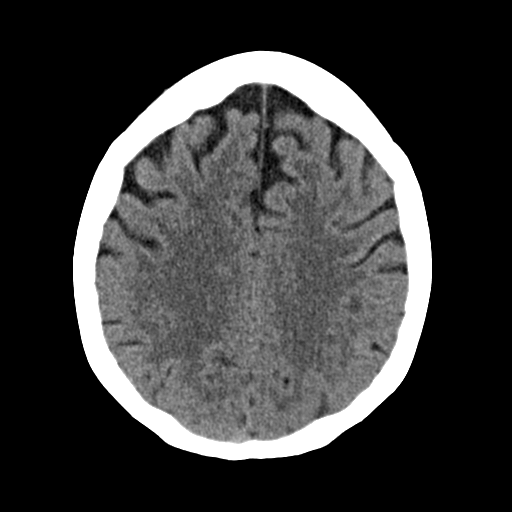
[im 21/34  bone]
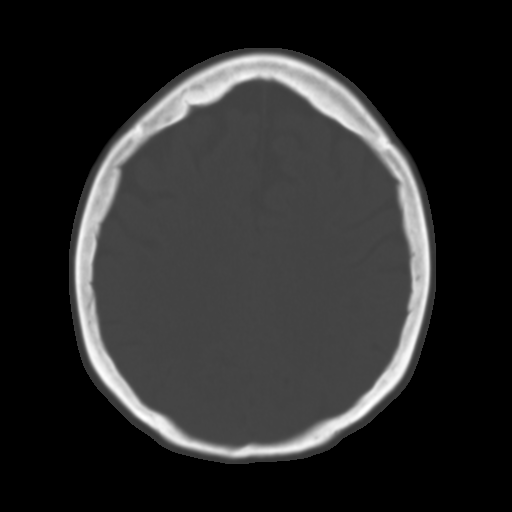
[im 25/34  brain]
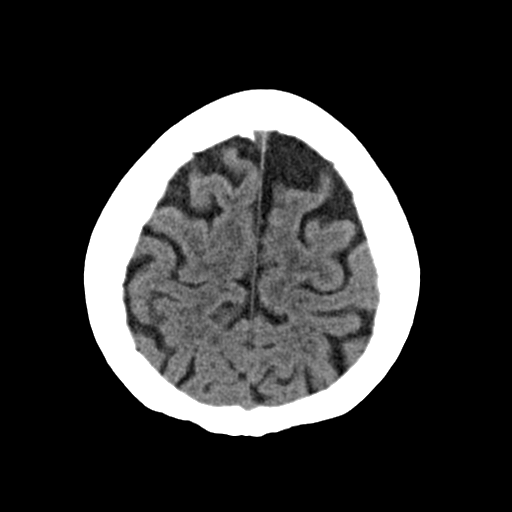
[im 29/34  brain]
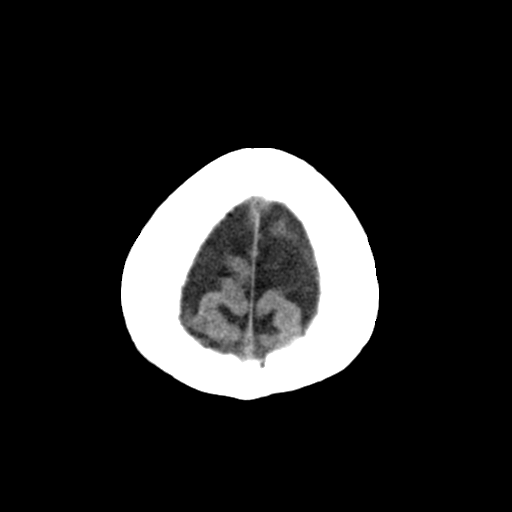

[Series 3: head bone · axial · 0.40mm/px · z∈[+824,+858]mm · 3 of 85 slices shown]
[im 9/85  bone]
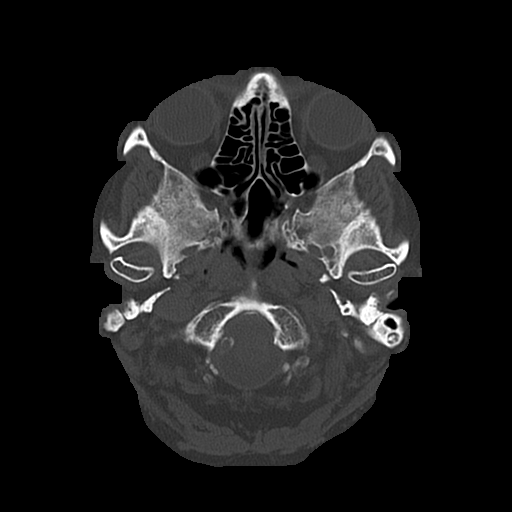
[im 17/85  bone]
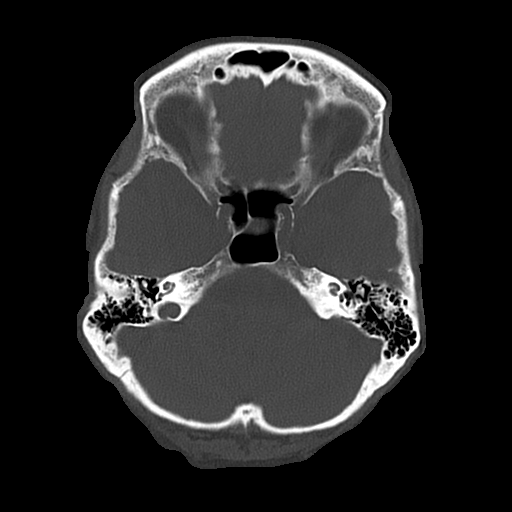
[im 26/85  bone]
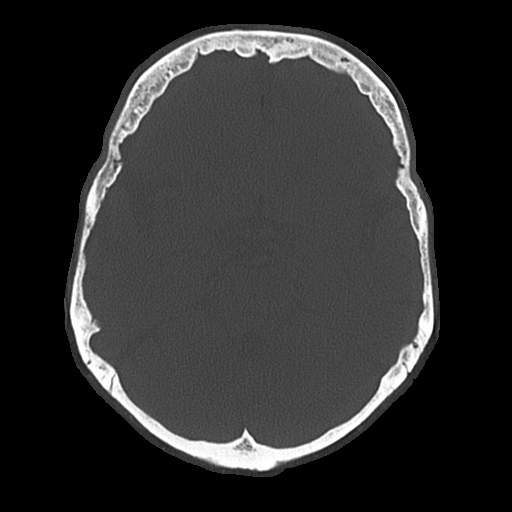

[Series 4: coronal soft · coronal · 0.34mm/px · 3 of 63 slices shown]
[im 21/63  brain]
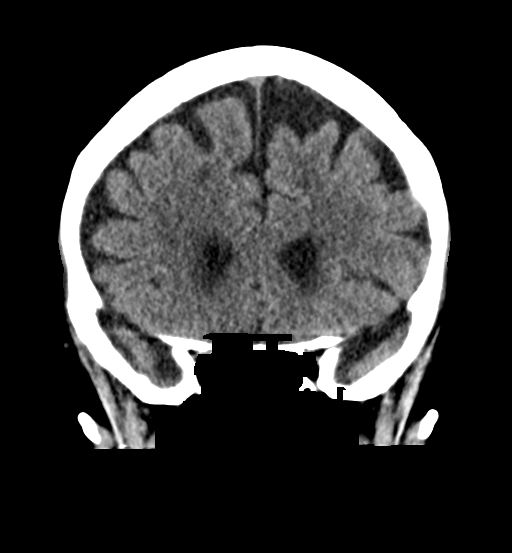
[im 28/63  brain]
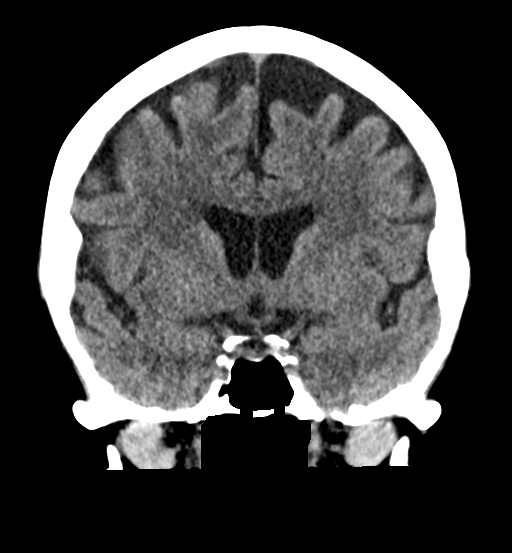
[im 35/63  brain]
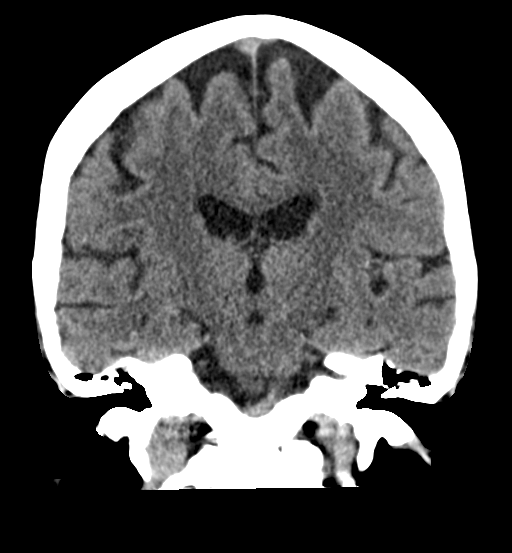

[Series 5: sagittal soft · sagittal · 0.37mm/px · 3 of 58 slices shown]
[im 20/58  brain]
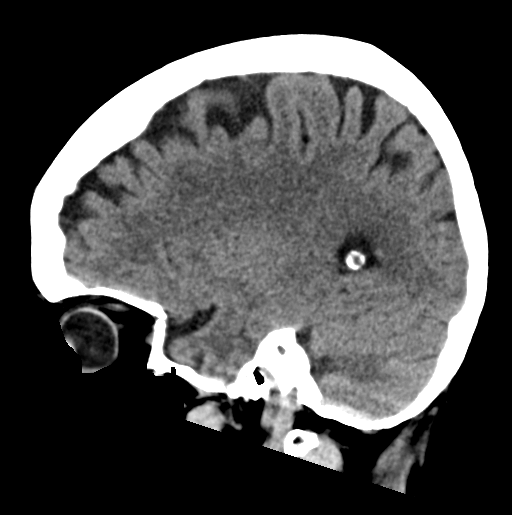
[im 29/58  brain]
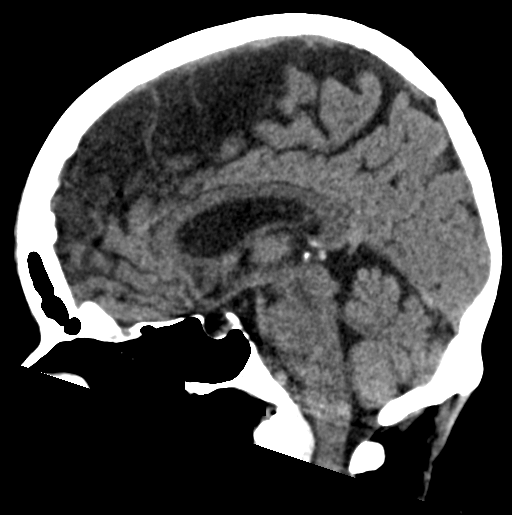
[im 39/58  brain]
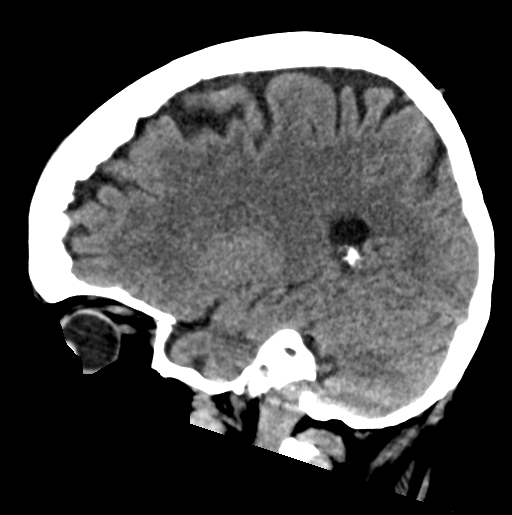

[16 of 47 positions shown; findings below may reference images not displayed]

FINDINGS: CT HEAD FINDINGS

Brain: Mild age related volume loss. No evidence of old or acute
focal infarction, mass lesion, hemorrhage, hydrocephalus or
extra-axial collection.

Vascular: There is atherosclerotic calcification of the major
vessels at the base of the brain.

Skull: No skull fracture.

Other: Right frontal scalp hematoma.

CT MAXILLOFACIAL FINDINGS

Osseous: No facial fracture.

Orbits: Orbits appear normal.  No traumatic finding.

Sinuses: Clear

Soft tissues: Negative

CT CERVICAL SPINE FINDINGS

Alignment: No traumatic malalignment.

Skull base and vertebrae: Chronic fusion C5 through C7. No evidence
of regional fracture.

Soft tissues and spinal canal: Negative

Disc levels: Chronic fusion from C5 through C7. Ordinary spondylosis
at C3-4 and C4-5 but no canal stenosis. Left facet degeneration at
C3-4 and C4-5 with mild left foraminal narrowing.

Upper chest: Negative

Other: Question polyp or mass at the aryepiglottic fold on the
right. Suggest ENT referral for evaluation of this finding.
IMPRESSION: Head CT: No skull fracture. No traumatic intracranial finding. Right
frontal scalp swelling.

Maxillofacial CT: No traumatic finding.

Cervical spine CT: No traumatic finding. Chronic fusion C5 through
C7. Mild degenerative changes.

Question polyp or mass associated with the aryepiglottic fold on the
right, measuring up to 1.5 cm in size. Suggest ENT referral for
follow-up of this abnormality.

## 2023-07-21 IMAGING — CT CT MAXILLOFACIAL W/O CM
3 series · 14 of 47 positions shown, 16 images · non-contrast
Comparison: 11/01/2015.  06/24/2013.

CLINICAL DATA: Fall with trauma to the head, face and neck.



[Series 2: 1 max soft · axial · 0.29mm/px · z∈[+719,+843]mm · 8 of 74 slices shown, 10 images]
[im 6/74  brain]
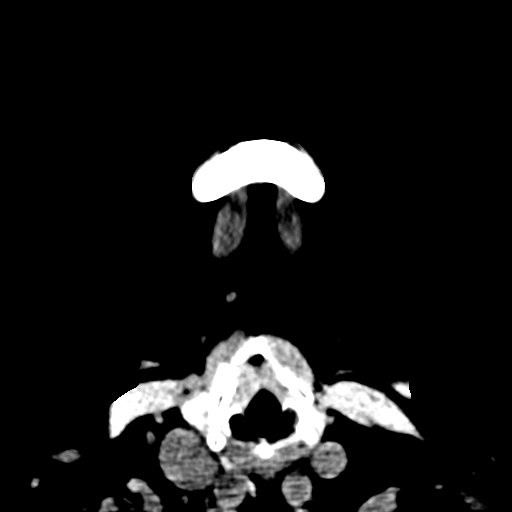
[im 6/74  bone]
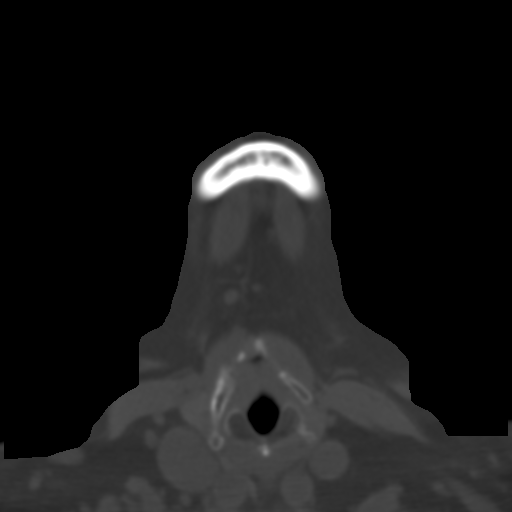
[im 16/74  bone]
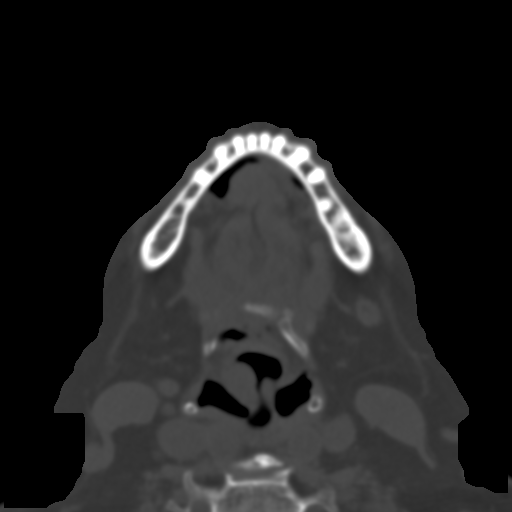
[im 23/74  bone]
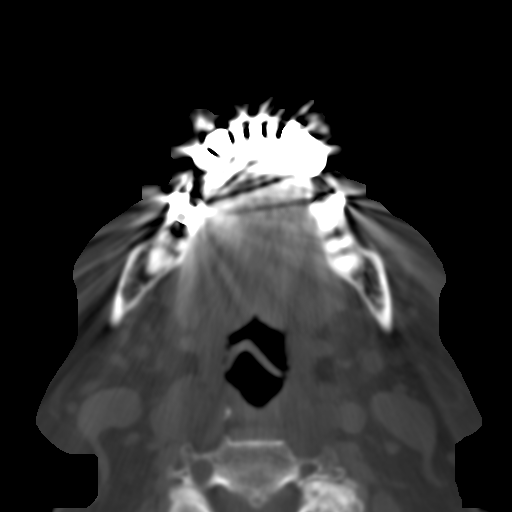
[im 33/74  bone]
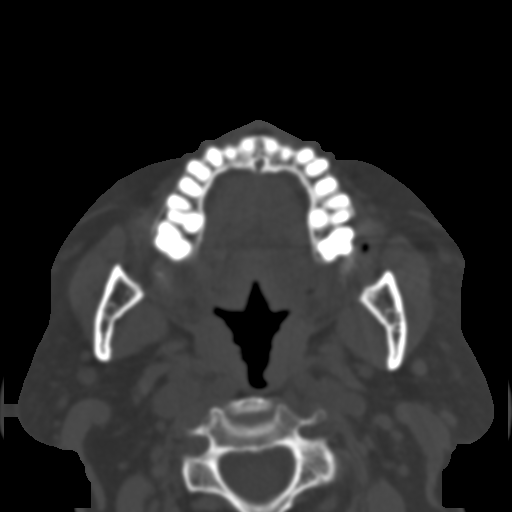
[im 41/74  brain]
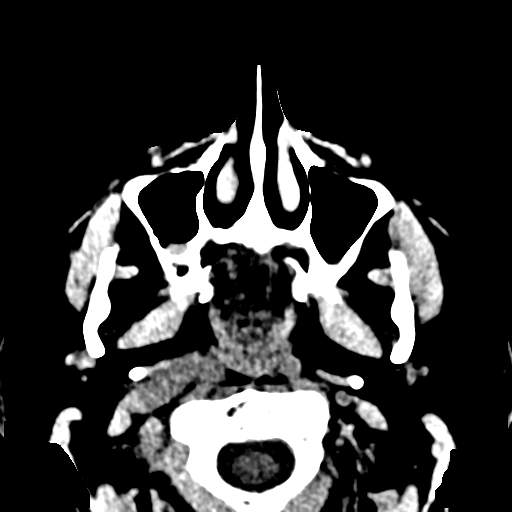
[im 41/74  bone]
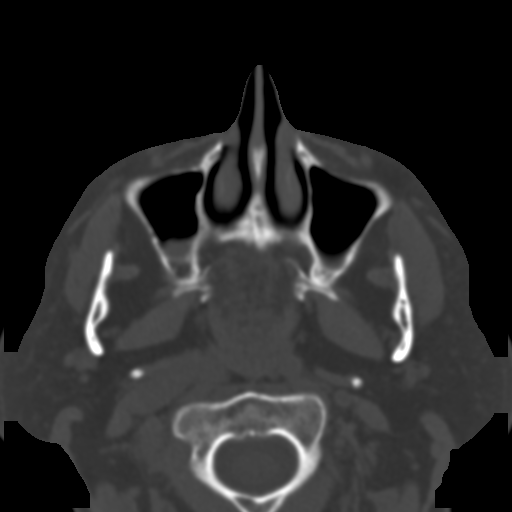
[im 51/74  bone]
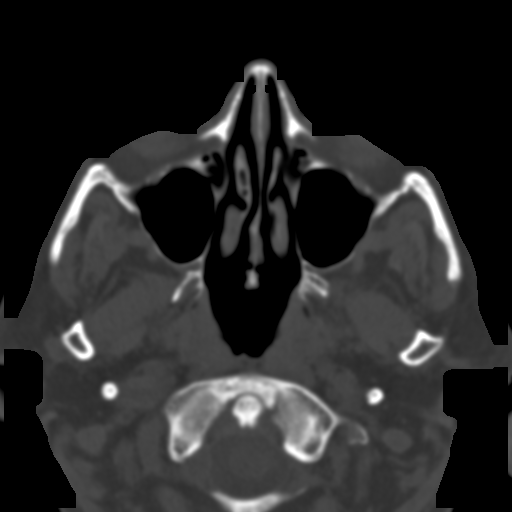
[im 58/74  bone]
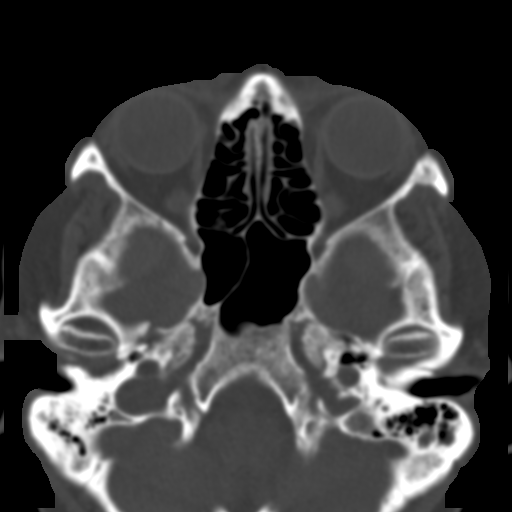
[im 68/74  bone]
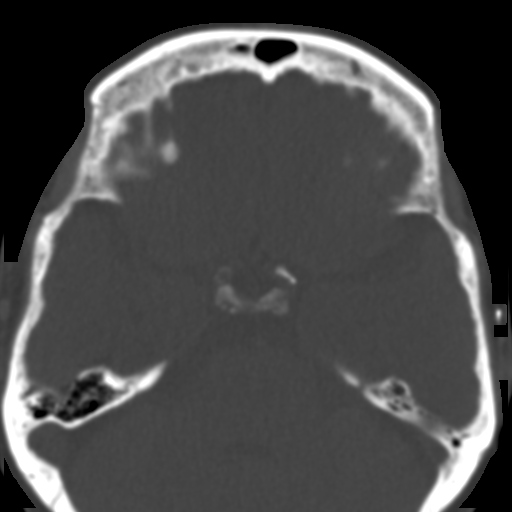

[Series 6: coronal soft · coronal · 0.31mm/px · 3 of 71 slices shown]
[im 24/71  bone]
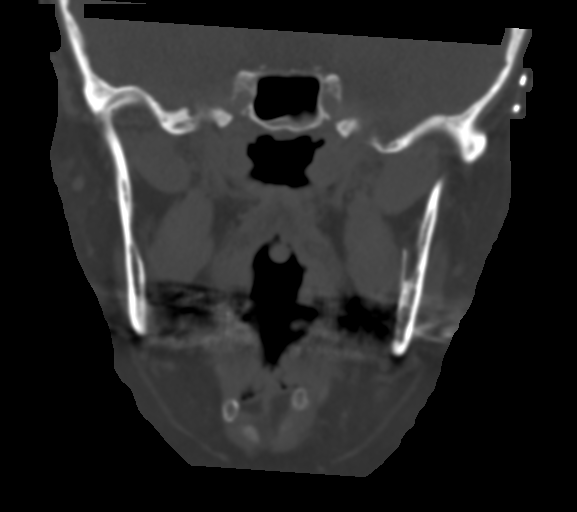
[im 32/71  bone]
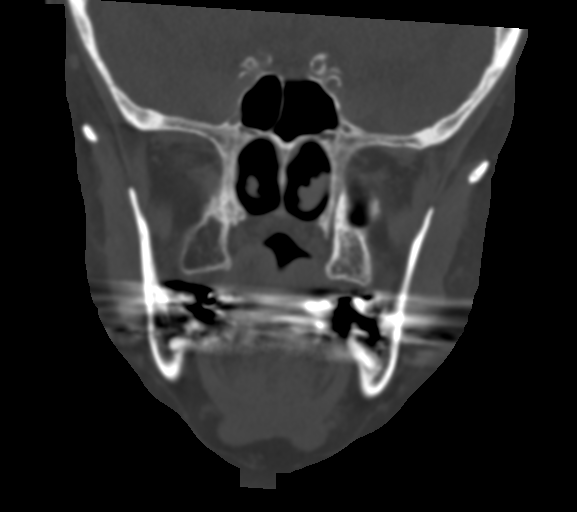
[im 39/71  bone]
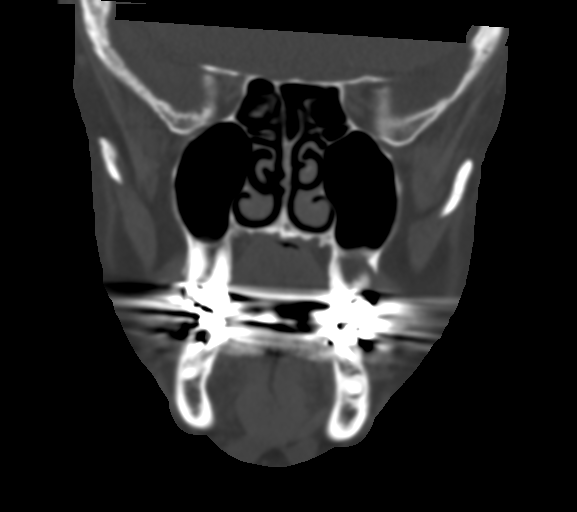

[Series 7: sagittal soft · sagittal · 0.31mm/px · 3 of 89 slices shown]
[im 30/89  bone]
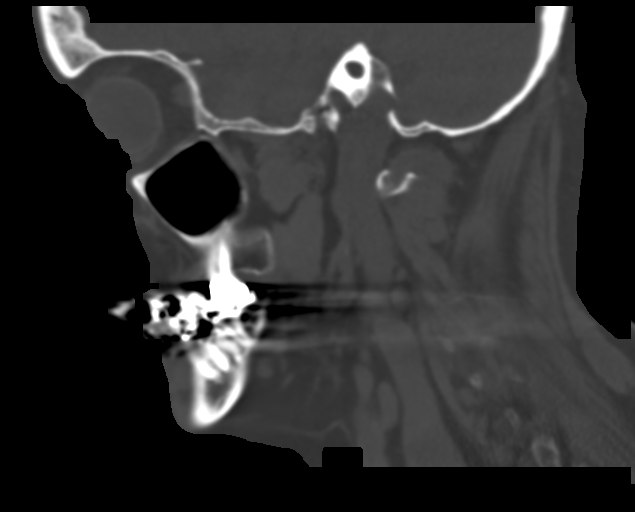
[im 45/89  bone]
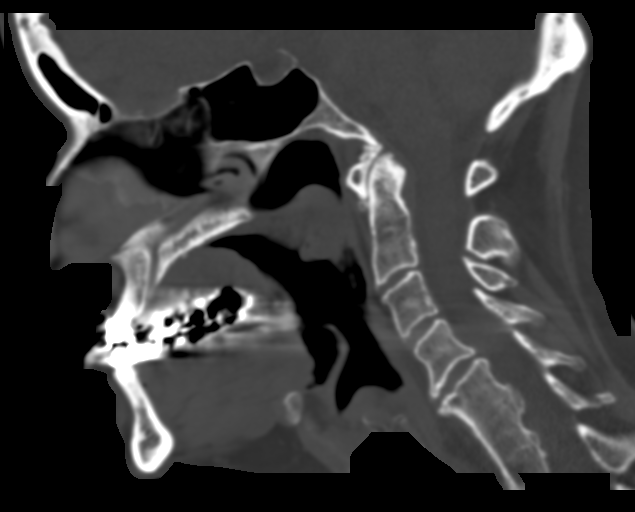
[im 59/89  bone]
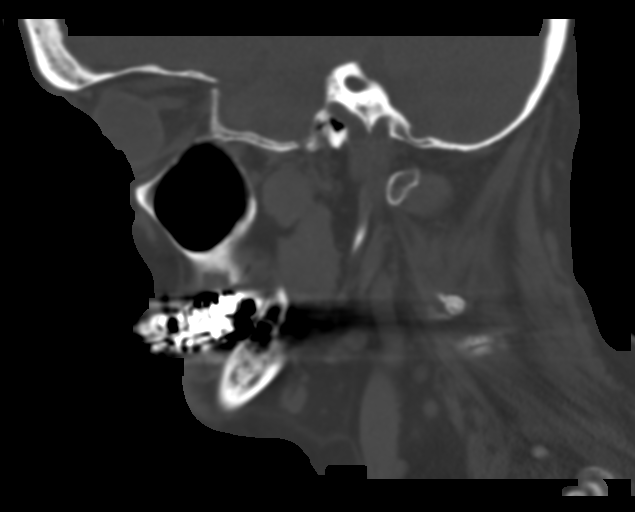

[14 of 47 positions shown; findings below may reference images not displayed]

FINDINGS: CT HEAD FINDINGS

Brain: Mild age related volume loss. No evidence of old or acute
focal infarction, mass lesion, hemorrhage, hydrocephalus or
extra-axial collection.

Vascular: There is atherosclerotic calcification of the major
vessels at the base of the brain.

Skull: No skull fracture.

Other: Right frontal scalp hematoma.

CT MAXILLOFACIAL FINDINGS

Osseous: No facial fracture.

Orbits: Orbits appear normal.  No traumatic finding.

Sinuses: Clear

Soft tissues: Negative

CT CERVICAL SPINE FINDINGS

Alignment: No traumatic malalignment.

Skull base and vertebrae: Chronic fusion C5 through C7. No evidence
of regional fracture.

Soft tissues and spinal canal: Negative

Disc levels: Chronic fusion from C5 through C7. Ordinary spondylosis
at C3-4 and C4-5 but no canal stenosis. Left facet degeneration at
C3-4 and C4-5 with mild left foraminal narrowing.

Upper chest: Negative

Other: Question polyp or mass at the aryepiglottic fold on the
right. Suggest ENT referral for evaluation of this finding.
IMPRESSION: Head CT: No skull fracture. No traumatic intracranial finding. Right
frontal scalp swelling.

Maxillofacial CT: No traumatic finding.

Cervical spine CT: No traumatic finding. Chronic fusion C5 through
C7. Mild degenerative changes.

Question polyp or mass associated with the aryepiglottic fold on the
right, measuring up to 1.5 cm in size. Suggest ENT referral for
follow-up of this abnormality.

## 2023-07-21 IMAGING — DX DG HAND COMPLETE 3+V*R*
3 series · 3 of 3 positions shown · non-contrast
Comparison: None Available.

CLINICAL DATA: Fall with right hand pain

EXAM:
RIGHT HAND - COMPLETE 3+ VIEW

[hand ap]
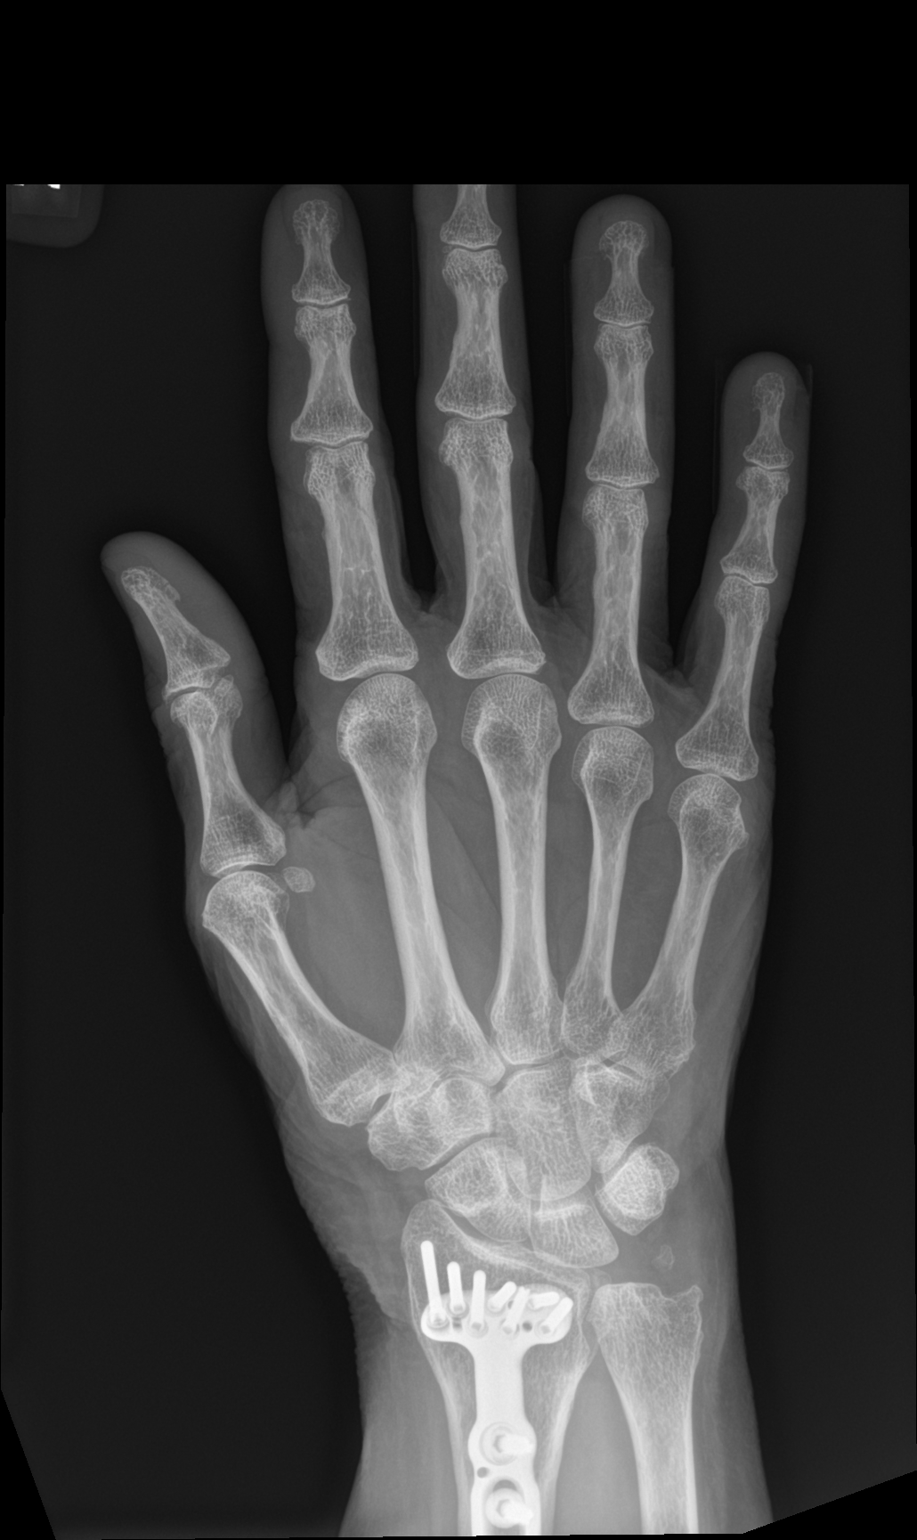

[hand obl]
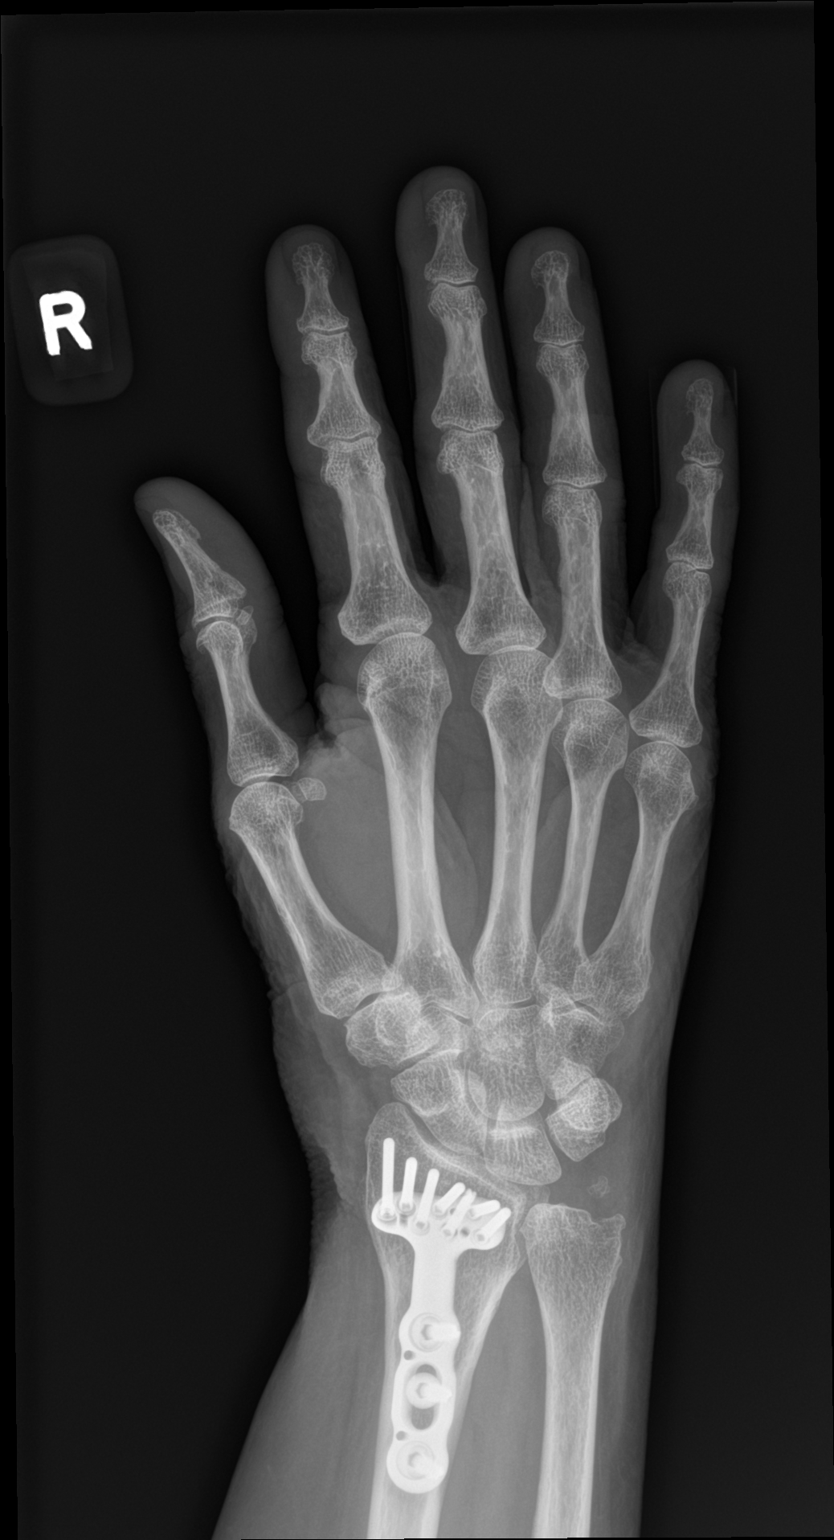

[hand lat]
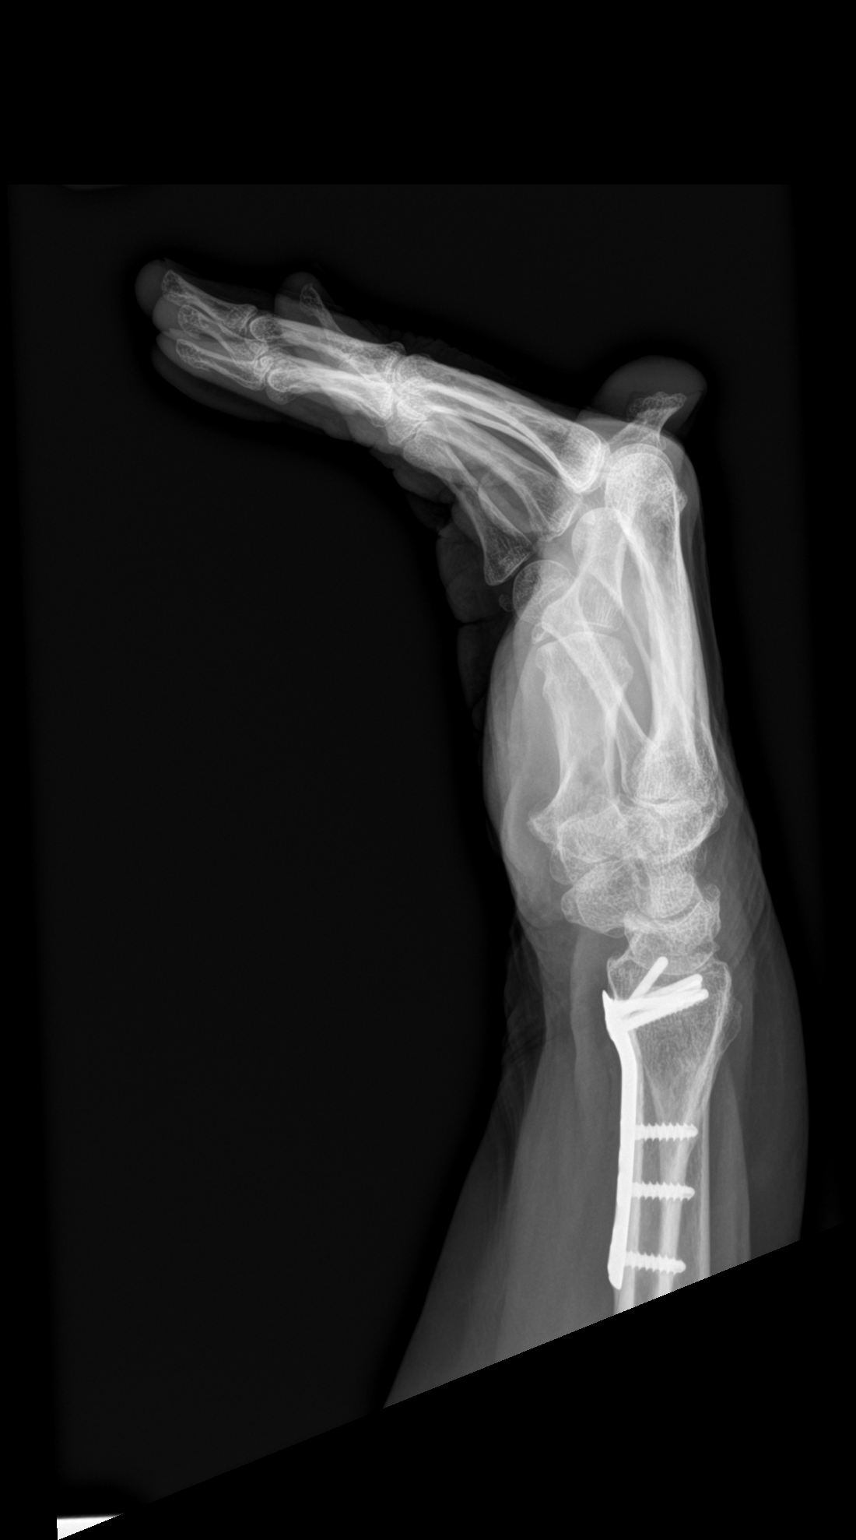

[3 of 3 positions shown; findings below may reference images not displayed]

FINDINGS: No fracture or malalignment. Surgical plate and fixating screws in
the distal radius. Mild degenerative changes at the first MCP joint.
IMPRESSION: No acute osseous abnormality

## 2023-07-22 DIAGNOSIS — H2511 Age-related nuclear cataract, right eye: Secondary | ICD-10-CM

## 2023-07-22 HISTORY — DX: Age-related nuclear cataract, right eye: H25.11

## 2023-08-08 DIAGNOSIS — M1990 Unspecified osteoarthritis, unspecified site: Secondary | ICD-10-CM | POA: Diagnosis not present

## 2023-08-08 DIAGNOSIS — Z79899 Other long term (current) drug therapy: Secondary | ICD-10-CM | POA: Diagnosis not present

## 2023-08-08 DIAGNOSIS — Z79631 Long term (current) use of antimetabolite agent: Secondary | ICD-10-CM | POA: Diagnosis not present

## 2023-08-08 DIAGNOSIS — M25571 Pain in right ankle and joints of right foot: Secondary | ICD-10-CM | POA: Diagnosis not present

## 2023-08-08 DIAGNOSIS — M3501 Sicca syndrome with keratoconjunctivitis: Secondary | ICD-10-CM | POA: Diagnosis not present

## 2023-08-27 ENCOUNTER — Other Ambulatory Visit: Payer: Self-pay | Admitting: Family Medicine

## 2023-09-18 DIAGNOSIS — N200 Calculus of kidney: Secondary | ICD-10-CM | POA: Diagnosis not present

## 2023-10-13 ENCOUNTER — Encounter: Payer: Self-pay | Admitting: Family Medicine

## 2023-10-13 ENCOUNTER — Ambulatory Visit
Admission: RE | Admit: 2023-10-13 | Discharge: 2023-10-13 | Disposition: A | Discharge status: Home / Self Care | Payer: Medicare PPO | Source: Ambulatory Visit | Attending: Family Medicine | Admitting: Family Medicine

## 2023-10-13 ENCOUNTER — Ambulatory Visit: Payer: Medicare PPO | Admitting: Family Medicine

## 2023-10-13 VITALS — BP 132/84 | HR 79 | Temp 97.7°F | Wt 173.2 lb

## 2023-10-13 DIAGNOSIS — W19XXXA Unspecified fall, initial encounter: Secondary | ICD-10-CM | POA: Diagnosis not present

## 2023-10-13 DIAGNOSIS — M3509 Sicca syndrome with other organ involvement: Secondary | ICD-10-CM

## 2023-10-13 DIAGNOSIS — M810 Age-related osteoporosis without current pathological fracture: Secondary | ICD-10-CM | POA: Diagnosis not present

## 2023-10-13 DIAGNOSIS — M5441 Lumbago with sciatica, right side: Secondary | ICD-10-CM

## 2023-10-13 DIAGNOSIS — I700 Atherosclerosis of aorta: Secondary | ICD-10-CM

## 2023-10-13 DIAGNOSIS — E1136 Type 2 diabetes mellitus with diabetic cataract: Secondary | ICD-10-CM

## 2023-10-13 DIAGNOSIS — M545 Low back pain, unspecified: Secondary | ICD-10-CM | POA: Diagnosis not present

## 2023-10-13 DIAGNOSIS — I10 Essential (primary) hypertension: Secondary | ICD-10-CM | POA: Diagnosis not present

## 2023-10-13 DIAGNOSIS — H26493 Other secondary cataract, bilateral: Secondary | ICD-10-CM

## 2023-10-13 DIAGNOSIS — E119 Type 2 diabetes mellitus without complications: Secondary | ICD-10-CM | POA: Diagnosis not present

## 2023-10-13 DIAGNOSIS — Y92009 Unspecified place in unspecified non-institutional (private) residence as the place of occurrence of the external cause: Secondary | ICD-10-CM

## 2023-10-13 DIAGNOSIS — H2513 Age-related nuclear cataract, bilateral: Secondary | ICD-10-CM

## 2023-10-13 DIAGNOSIS — E088 Diabetes mellitus due to underlying condition with unspecified complications: Secondary | ICD-10-CM

## 2023-10-13 DIAGNOSIS — M4186 Other forms of scoliosis, lumbar region: Secondary | ICD-10-CM | POA: Diagnosis not present

## 2023-10-13 DIAGNOSIS — M4185 Other forms of scoliosis, thoracolumbar region: Secondary | ICD-10-CM | POA: Diagnosis not present

## 2023-10-13 DIAGNOSIS — M546 Pain in thoracic spine: Secondary | ICD-10-CM | POA: Insufficient documentation

## 2023-10-13 DIAGNOSIS — Z23 Encounter for immunization: Secondary | ICD-10-CM

## 2023-10-13 DIAGNOSIS — I517 Cardiomegaly: Secondary | ICD-10-CM

## 2023-10-13 DIAGNOSIS — N200 Calculus of kidney: Secondary | ICD-10-CM

## 2023-10-13 DIAGNOSIS — M8580 Other specified disorders of bone density and structure, unspecified site: Secondary | ICD-10-CM | POA: Diagnosis not present

## 2023-10-13 DIAGNOSIS — M5136 Other intervertebral disc degeneration, lumbar region with discogenic back pain only: Secondary | ICD-10-CM | POA: Diagnosis not present

## 2023-10-13 HISTORY — DX: Age-related osteoporosis without current pathological fracture: M81.0

## 2023-10-13 HISTORY — DX: Pain in thoracic spine: M54.6

## 2023-10-13 LAB — LIPID PANEL
Cholesterol: 121 mg/dL (ref 0–200)
HDL: 63.4 mg/dL (ref >39.00)
LDL Cholesterol: 36 mg/dL (ref 0–99)
NonHDL: 57.98
Total CHOL/HDL Ratio: 2
Triglycerides: 109 mg/dL (ref 0.0–149.0)
VLDL: 21.8 mg/dL (ref 0.0–40.0)

## 2023-10-13 LAB — COMPREHENSIVE METABOLIC PANEL
ALT: 23 U/L (ref 0–35)
AST: 23 U/L (ref 0–37)
Albumin: 4 g/dL (ref 3.5–5.2)
Alkaline Phosphatase: 73 U/L (ref 39–117)
BUN: 18 mg/dL (ref 6–23)
CO2: 25 meq/L (ref 19–32)
Calcium: 9.2 mg/dL (ref 8.4–10.5)
Chloride: 106 meq/L (ref 96–112)
Creatinine, Ser: 0.73 mg/dL (ref 0.40–1.20)
GFR: 80.5 mL/min (ref >60.00)
Glucose, Bld: 103 mg/dL — ABNORMAL HIGH (ref 70–99)
Potassium: 4 meq/L (ref 3.5–5.1)
Sodium: 142 meq/L (ref 135–145)
Total Bilirubin: 0.4 mg/dL (ref 0.2–1.2)
Total Protein: 6.9 g/dL (ref 6.0–8.3)

## 2023-10-13 LAB — CBC WITH DIFFERENTIAL/PLATELET
Basophils Absolute: 0.1 10*3/uL (ref 0.0–0.1)
Basophils Relative: 1.1 % (ref 0.0–3.0)
Eosinophils Absolute: 0.1 10*3/uL (ref 0.0–0.7)
Eosinophils Relative: 1.2 % (ref 0.0–5.0)
HCT: 36 % (ref 36.0–46.0)
Hemoglobin: 11.7 g/dL — ABNORMAL LOW (ref 12.0–15.0)
Lymphocytes Relative: 26.1 % (ref 12.0–46.0)
Lymphs Abs: 1.9 10*3/uL (ref 0.7–4.0)
MCHC: 32.4 g/dL (ref 30.0–36.0)
MCV: 93.8 fL (ref 78.0–100.0)
Monocytes Absolute: 0.7 10*3/uL (ref 0.1–1.0)
Monocytes Relative: 10.4 % (ref 3.0–12.0)
Neutro Abs: 4.3 10*3/uL (ref 1.4–7.7)
Neutrophils Relative %: 61.2 % (ref 43.0–77.0)
Platelets: 311 10*3/uL (ref 150.0–400.0)
RBC: 3.84 Mil/uL — ABNORMAL LOW (ref 3.87–5.11)
RDW: 15.9 % — ABNORMAL HIGH (ref 11.5–15.5)
WBC: 7.1 10*3/uL (ref 4.0–10.5)

## 2023-10-13 LAB — MICROALBUMIN / CREATININE URINE RATIO
Creatinine,U: 96.3 mg/dL
Microalb Creat Ratio: 2.6 mg/g (ref 0.0–30.0)
Microalb, Ur: 2.5 mg/dL — ABNORMAL HIGH (ref 0.0–1.9)

## 2023-10-13 LAB — HEMOGLOBIN A1C: Hgb A1c MFr Bld: 6.5 % (ref 4.6–6.5)

## 2023-10-13 LAB — HIGH SENSITIVITY CRP: CRP, High Sensitivity: 1.68 mg/L (ref 0.000–5.000)

## 2023-10-13 LAB — TSH: TSH: 2.34 u[IU]/mL (ref 0.35–5.50)

## 2023-10-13 LAB — SEDIMENTATION RATE: Sed Rate: 29 mm/h (ref 0–30)

## 2023-10-13 MED ORDER — BACLOFEN 10 MG PO TABS: 10.0000 mg | ORAL_TABLET | Freq: Three times a day (TID) | ORAL | 0 refills | Status: DC

## 2023-10-13 NOTE — Assessment & Plan Note (Signed)
Stable. Follows with cardiology.   Plan:  Continue rosuvastatin 10 mg for aortic atherosclerosis and cholesterol management. Follow-up with cardiology. Monitor and manage blood pressure with the continuation of antihypertensive therapy. Labs for cardiac risk stratification, diabetes and cholesterol management

## 2023-10-13 NOTE — Progress Notes (Addendum)
Assessment/Plan:   Problem List Items Addressed This Visit       Cardiovascular and Mediastinum   Essential hypertension   Cardiomegaly    Stable. Follows with cardiology.   Plan:  Continue rosuvastatin 10 mg for aortic atherosclerosis and cholesterol management. Follow-up with cardiology. Monitor and manage blood pressure with the continuation of antihypertensive therapy. Labs for cardiac risk stratification, diabetes and cholesterol management      Relevant Orders   TSH (Completed)   Aortic atherosclerosis (HCC)   Relevant Orders   Lipid panel (Completed)     Endocrine   Diabetes mellitus (HCC) - Primary     Musculoskeletal and Integument   Age-related osteoporosis without current pathological fracture   Relevant Orders   Vitamin D 1,25 dihydroxy (Completed)     Genitourinary   Kidney stone   Relevant Orders   Urinalysis w microscopic + reflex cultur (Completed)     Other   Cataract   Sjogren syndrome with other organ involvement (HCC)   Relevant Orders   CBC with Differential/Platelet (Completed)   Comprehensive metabolic panel (Completed)   Sedimentation rate (Completed)   CRP High sensitivity (Completed)   Fall at home   Relevant Orders   DG Thoracic Spine W/Swimmers (Completed)   Low back pain    New right lower back pain likely sciatica, exacerbated by recent fall.  Plan: Continue Diclofenac Sodium (Voltaren) 1% gel QID PRN. Prescribe Baclofen 10mg  TID as needed for muscle relaxation. Continue Tylenol and topical lidocaine Order lumbar and thoracic spine X-rays. Consider physical therapy if pain persists or worsens. Urinalysis to rule out kidney stones.      Relevant Medications   baclofen (LIORESAL) 10 MG tablet   Other Relevant Orders   DG Lumbar Spine Complete (Completed)   Acute right-sided thoracic back pain   Relevant Medications   baclofen (LIORESAL) 10 MG tablet   Other Relevant Orders   DG Thoracic Spine W/Swimmers (Completed)    Other Visit Diagnoses     Immunization due       Relevant Orders   Pneumococcal conjugate vaccine 20-valent (Completed)       There are no discontinued medications.  Return in about 6 months (around 04/11/2024), or for back pain, if symptoms worsen or fail to improve, for DM.    Subjective:   Encounter date: 10/13/2023  Kayla Sampson is a 77 y.o. female who has Allergic rhinitis; Current mild episode of major depressive disorder without prior episode (HCC); GERD (gastroesophageal reflux disease); Inflammatory arthritis; Omphalitis in adult; Prediabetes; Cataract; Nephrolithiasis; Essential hypertension; Sjogren syndrome with other organ involvement (HCC); OAB (overactive bladder); Recurrent UTI; Hiatal hernia; Benign essential tremor syndrome; COVID-19; Cardiomegaly; Cervical stenosis of spine; Hypertension; Kidney stone; Occasional tremors; Osteoarthritis; Trigeminal neuralgia pain; Actinic keratosis; Anterior basement membrane dystrophy; Class 1 obesity due to excess calories with serious comorbidity and body mass index (BMI) of 34.0 to 34.9 in adult; Closed fracture of distal end of right radius; Closed fracture of right wrist; Depression; Drug therapy; Dry eye syndrome, bilateral; Early dry stage nonexudative age-related macular degeneration of both eyes; Fall at home; Gait abnormality; Herpes zoster without complication; High risk medication use; Hypothyroidism; Imbalance; Increased urinary frequency; Laryngeal cyst; Long-term use of Plaquenil; Low back pain; Lung nodule, multiple; Microscopic hematuria; Neck pain; Nondisplaced fracture of right ulna styloid process, initial encounter for closed fracture; Normocytic anemia; Osteopenia; Presbyopia of both eyes; SOB (shortness of breath) on exertion; Squamous 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 eyes; Vitreous  floater, bilateral; Spinal stenosis, cervical region; Sialoadenitis; Acute kidney injury (HCC); Sjogren's syndrome with  keratoconjunctivitis sicca (HCC); Mixed dyslipidemia; Diabetes mellitus (HCC); Obesity (BMI 30.0-34.9); Aortic atherosclerosis (HCC); Dyspnea on exertion; Cardiac murmur; Grief; Chronic pain of left knee; Age-related osteoporosis without current pathological fracture; and Acute right-sided thoracic back pain on their problem list..   She  has a past medical history of Actinic keratosis (02/13/2016), Acute kidney injury (HCC) (05/05/2021), Age-related nuclear cataract, bilateral (02/13/2016), Allergic rhinitis (02/13/2016), Anterior basement membrane dystrophy (02/28/2020), Benign essential tremor syndrome (05/31/2022), Cardiomegaly (01/09/2023), Cervical stenosis of spine, Class 1 obesity due to excess calories with serious comorbidity and body mass index (BMI) of 34.0 to 34.9 in adult (11/03/2021), Closed fracture of distal end of right radius (03/06/2016), Closed fracture of right wrist (03/06/2016), COVID-19 (07/08/2022), Current mild episode of major depressive disorder without prior episode (HCC) (04/30/2017), Depression (01/29/2023), Drug therapy (02/13/2016), Dry eye syndrome, bilateral (02/28/2020), Early dry stage nonexudative age-related macular degeneration of both eyes (06/30/2017), Essential hypertension (03/06/2016), Fall at home (03/06/2016), Gait abnormality (02/16/2020), GERD (gastroesophageal reflux disease) (02/13/2016), Herpes zoster without complication (06/26/2021), Hiatal hernia, High risk medication use (06/30/2017), Hypertension, Hypothyroidism (01/29/2023), Imbalance (04/08/2016), Increased urinary frequency (01/29/2023), Inflammatory arthritis (02/13/2016), Kidney stone, Laryngeal cyst (05/08/2022), Long-term use of Plaquenil (02/16/2020), Low back pain (05/20/2019), Lung nodule, multiple (01/29/2023), Microscopic hematuria (01/29/2023), Neck pain (04/08/2016), Nephrolithiasis (02/16/2020), Nondisplaced fracture of right ulna styloid process, initial encounter for closed fracture  (03/06/2016), Normocytic anemia (03/06/2016), OAB (overactive bladder) (05/31/2022), Occasional tremors, Omphalitis in adult (02/13/2016), Osteoarthritis, Osteopenia (02/13/2016), Prediabetes (05/17/2022), Presbyopia of both eyes (02/13/2016), Recurrent UTI (05/31/2022), Sialoadenitis (01/29/2023), Sjogren syndrome with other organ involvement (HCC) (05/31/2022), Sjogren's syndrome with keratoconjunctivitis sicca (HCC) (02/13/2016), SOB (shortness of breath) on exertion (04/01/2017), Spinal stenosis, cervical region (02/13/2016), Squamous 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   CHIEF COMPLAINT: The patient presented for follow-up on high blood pressure, elevated blood sugars, concerns about a recent finding of an enlarged heart on a CT scan, chronic right knee pain, and recent right lower back pain radiating down the right leg.  HISTORY OF PRESENT ILLNESS:  Cardiovascular: The patient has a history of high blood pressure, currently well-controlled.  Managed and is here for a follow-up. Previously reported mild mitral valve regurgitation and is on statin therapy with LDL last checked at 96.  History of cardiomegaly.  Recently seen on CT of an cardiomegaly and aortic atherosclerosis.  Patient with recent follow-up with cardiology showed stable.    Diabetes mellitus: Under management for elevated blood sugar levels with Metformin.   Inflammatory arthritis/Sjogren's syndrome: Continues on methotrexate and Plaquenil for inflammatory arthritis.   Age-related cataracts: Cataract surgery planned for the upcoming summer.  Acute lower back pain: Reported the onset of right lower back pain two weeks ago, originating below the hip and radiating to the right knee. The pain started following a fall at home where the patient twisted their back.  Review of Systems  Constitutional:  Positive for malaise/fatigue  (chronic, unchanged).  Respiratory:  Positive for shortness of breath (chronic, unchanged). Negative for wheezing.   Cardiovascular:  Negative for chest pain and leg swelling.  Genitourinary:  Negative for dysuria, frequency, hematuria and urgency.  Musculoskeletal:  Positive for back pain, falls and joint pain.  Neurological:  Positive for weakness.  Endo/Heme/Allergies:  Negative for polydipsia.  All other systems reviewed and are negative.   Past Surgical History:  Procedure Laterality Date  ABDOMINAL HYSTERECTOMY     ANKLE FRACTURE SURGERY     ANTERIOR CERVICAL DECOMP/DISCECTOMY FUSION     THYROGLOSSAL DUCT CYST     TUBAL LIGATION      Outpatient Medications Prior to Visit  Medication Sig Dispense Refill   amlodipine-olmesartan (AZOR) 10-20 MG tablet TAKE 1 TABLET BY MOUTH DAILY 90 tablet 0   diclofenac Sodium (VOLTAREN) 1 % GEL Apply 4 g topically 4 (four) times daily as needed. 100 g 3   DULoxetine (CYMBALTA) 60 MG capsule TAKE 1 CAPSULE(60 MG) BY MOUTH DAILY 90 capsule 3   folic acid (FOLVITE) 1 MG tablet Take 1 tablet by mouth daily.     hydroxychloroquine (PLAQUENIL) 200 MG tablet Take 200 mg by mouth 2 (two) times daily.     metFORMIN (GLUCOPHAGE-XR) 500 MG 24 hr tablet TAKE 1 TABLET(500 MG) BY MOUTH DAILY 90 tablet 2   methotrexate (RHEUMATREX) 2.5 MG tablet Take 7 tablets by mouth once a week.     metoprolol succinate (TOPROL-XL) 25 MG 24 hr tablet TAKE 1 TABLET(25 MG) BY MOUTH DAILY 90 tablet 1   Multiple Vitamins-Minerals (PRESERVISION AREDS 2) CAPS Take 1 tablet by mouth 2 (two) times daily.     MYRBETRIQ 25 MG TB24 tablet Take 25 mg by mouth daily.     rosuvastatin (CRESTOR) 10 MG tablet Take 1 tablet (10 mg total) by mouth daily. 90 tablet 3   Cholecalciferol (VITAMIN D-1000 MAX ST) 25 MCG (1000 UT) tablet Take 1,000 Units by mouth daily. (Patient not taking: Reported on 10/13/2023)     Flaxseed, Linseed, (FLAXSEED OIL) 1200 MG CAPS Take 1,200 mg by mouth daily.  (Patient not taking: Reported on 04/10/2023)     Multiple Vitamins-Minerals (CENTRUM SILVER 50+WOMEN PO) Take 1 tablet by mouth daily. (Patient not taking: Reported on 02/04/2023)     No facility-administered medications prior to visit.    Family History  Problem Relation Age of Onset   Hypertension Mother    Heart attack Father    Hypertension Father    Diabetes Father    Cancer Paternal Grandfather     Social History   Socioeconomic History   Marital status: Married    Spouse name: Not on file   Number of children: Not on file   Years of education: Not on file   Highest education level: Some college, no degree  Occupational History   Not on file  Tobacco Use   Smoking status: Never   Smokeless tobacco: Not on file  Vaping Use   Vaping status: Never Used  Substance and Sexual Activity   Alcohol use: No   Drug use: No   Sexual activity: Not on file  Other Topics Concern   Not on file  Social History Narrative   Not on file   Social Determinants of Health   Financial Resource Strain: Low Risk  (10/10/2023)   Overall Financial Resource Strain (CARDIA)    Difficulty of Paying Living Expenses: Not hard at all  Food Insecurity: No Food Insecurity (10/10/2023)   Hunger Vital Sign    Worried About Running Out of Food in the Last Year: Never true    Ran Out of Food in the Last Year: Never true  Transportation Needs: No Transportation Needs (10/10/2023)   PRAPARE - Administrator, Civil Service (Medical): No    Lack of Transportation (Non-Medical): No  Physical Activity: Unknown (10/10/2023)   Exercise Vital Sign    Days of Exercise per Week: 0  days    Minutes of Exercise per Session: Not on file  Stress: Stress Concern Present (10/10/2023)   [employer] of Occupational Health - Occupational Stress Questionnaire    Feeling of Stress : To some extent  Social Connections: Moderately Isolated (10/10/2023)   Social Connection and Isolation Panel [NHANES]     Frequency of Communication with Friends and Family: Three times a week    Frequency of Social Gatherings with Friends and Family: Once a week    Attends Religious Services: Never    Database administrator or Organizations: No    Attends Engineer, structural: Not on file    Marital Status: Married  Intimate Partner Violence: Unknown (10/22/2022)   Received from [employer], [facility]   HITS    Physically Hurt: Not on file    Insult or Talk Down To: Not on file    Threaten Physical Harm: Not on file    Scream or Curse: Not on file                                                                                                  Objective:  Physical Exam: BP 132/84 (BP Location: Left Arm, Patient Position: Sitting, Cuff Size: Large)   Pulse 79   Temp 97.7 F (36.5 C) (Temporal)   Wt 173 lb 3.2 oz (78.6 kg)   SpO2 99%   BMI 30.68 kg/m     Physical Exam Constitutional:      General: She is not in acute distress.    Appearance: Normal appearance. She is not ill-appearing or toxic-appearing.  HENT:     Head: Normocephalic and atraumatic.     Nose: Nose normal. No congestion.  Eyes:     General: No scleral icterus.    Extraocular Movements: Extraocular movements intact.  Cardiovascular:     Rate and Rhythm: Normal rate and regular rhythm.     Pulses: Normal pulses.     Heart sounds: Normal heart sounds.  Pulmonary:     Effort: Pulmonary effort is normal. No respiratory distress.     Breath sounds: Normal breath sounds.  Abdominal:     General: Abdomen is flat. Bowel sounds are normal.     Palpations: Abdomen is soft.  Musculoskeletal:        General: Normal range of motion.     Thoracic back: Tenderness present. No spasms.     Lumbar back: Tenderness present. No spasms. Negative right straight leg raise test and negative left straight leg raise test.  Lymphadenopathy:     Cervical: No cervical adenopathy.  Skin:    General: Skin is warm and dry.      Findings: No rash.  Neurological:     General: No focal deficit present.     Mental Status: She is alert and oriented to person, place, and time. Mental status is at baseline.     Gait: Gait abnormal (walks with cane).  Psychiatric:        Mood and Affect: Mood normal.        Behavior: Behavior normal.  Thought Content: Thought content normal.        Judgment: Judgment normal.    Diabetic Foot Exam - Simple   Simple Foot Form Diabetic Foot exam was performed with the following findings: Yes 10/13/2023  9:29 AM  Visual Inspection No deformities, no ulcerations, no other skin breakdown bilaterally: Yes Sensation Testing Intact to touch and monofilament testing bilaterally: Yes Pulse Check Posterior Tibialis and Dorsalis pulse intact bilaterally: Yes Comments      DG Lumbar Spine Complete  Result Date: 10/20/2023 CLINICAL DATA:  77 year old female with mid and lower back pain for 2 weeks after fall. EXAM: LUMBAR SPINE - COMPLETE 4+ VIEW COMPARISON:  Thoracic radiographs the same day reported separately. CT Abdomen and Pelvis 12/05/2022 (partial images available). FINDINGS: Normal lumbar segmentation. Chronic mild dextroconvex lumbar scoliosis. Lower thoracic through T12-L1 but no lumbar intervertebral ankylosis identified. Vacuum disc, severe disc space loss and bulky endplate spurring at L2-L3. Maintained vertebral height. Less pronounced disc space loss elsewhere. Grossly intact visible sacrum. No acute osseous abnormality identified. Abdominal Calcified aortic atherosclerosis. Nonobstructed bowel-gas pattern. IMPRESSION: 1. No acute osseous abnormality identified in the lumbar spine. 2. Chronic mild dextroconvex lumbar scoliosis. Thoracolumbar junction but no additional lumbar ankylosis. Severe chronic disc and endplate degeneration at L2-L3. 3.  Aortic Atherosclerosis (ICD10-I70.0). Electronically Signed   By: Odessa Fleming M.D.   On: 10/20/2023 11:35   DG Thoracic Spine  W/Swimmers  Result Date: 10/20/2023 CLINICAL DATA:  77 year old female with mid and lower back pain for 2 weeks after fall. EXAM: THORACIC SPINE - 3 VIEWS COMPARISON:  Chest radiographs 02/16/2020. FINDINGS: Mild S shaped thoracolumbar scoliosis, stable since 2021. Evidence of widespread thoracic spine interbody ankylosis from flowing endplate osteophytes or syndesmophytes. Maintained thoracic vertebral height. Diffuse osteopenia. Relatively maintained disc spaces. Maintained thoracic kyphosis. Cervicothoracic junction alignment is within normal limits. Abnormal lower cervical spine ankylosis also appears to be from previous operative fusion. No lower cervical hardware visible. No acute osseous abnormality identified. Stable visible chest. Abdomen Calcified aortic atherosclerosis. IMPRESSION: 1. Chronic thoracic scoliosis. 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 Osteopenia. No acute osseous abnormality identified. 2. Chronic lower cervical fusion. 3.  Aortic Atherosclerosis (ICD10-I70.0). Electronically Signed   By: Odessa Fleming M.D.   On: 10/20/2023 11:32    Recent Results (from the past 2160 hour(s))  TSH     Status: None   Collection Time: 10/13/23  9:34 AM  Result Value Ref Range   TSH 2.34 0.35 - 5.50 uIU/mL  Lipid panel     Status: None   Collection Time: 10/13/23  9:34 AM  Result Value Ref Range   Cholesterol 121 0 - 200 mg/dL    Comment: ATP III Classification       Desirable:  < 200 mg/dL               Borderline High:  200 - 239 mg/dL          High:  > = 161 mg/dL   Triglycerides 096.0 0.0 - 149.0 mg/dL    Comment: Normal:  <454 mg/dLBorderline High:  150 - 199 mg/dL   HDL 09.81 >19.14 mg/dL   VLDL 78.2 0.0 - 95.6 mg/dL   LDL Cholesterol 36 0 - 99 mg/dL   Total CHOL/HDL Ratio 2     Comment:                Men          Women1/2 Average Risk  3.4          3.3Average Risk          5.0          4.42X Average Risk          9.6          7.13X Average Risk           15.0          11.0                       NonHDL 57.98     Comment: NOTE:  Non-HDL goal should be 30 mg/dL higher than patient's LDL goal (i.e. LDL goal of < 70 mg/dL, would have non-HDL goal of < 100 mg/dL)  Hemoglobin U0A     Status: None   Collection Time: 10/13/23  9:34 AM  Result Value Ref Range   Hgb A1c MFr Bld 6.5 4.6 - 6.5 %    Comment: Glycemic Control Guidelines for People with Diabetes:Non Diabetic:  <6%Goal of Therapy: <7%Additional Action Suggested:  >8%   Microalbumin / creatinine urine ratio     Status: Abnormal   Collection Time: 10/13/23  9:34 AM  Result Value Ref Range   Microalb, Ur 2.5 (H) 0.0 - 1.9 mg/dL   Creatinine,U 54.0 mg/dL   Microalb Creat Ratio 2.6 0.0 - 30.0 mg/g  Vitamin D 1,25 dihydroxy     Status: None   Collection Time: 10/13/23  9:34 AM  Result Value Ref Range   Vitamin D 1, 25 (OH)2 Total 32 18 - 72 pg/mL   Vitamin D3 1, 25 (OH)2 32 pg/mL   Vitamin D2 1, 25 (OH)2 <8 pg/mL    Comment: (Note) Vitamin D3, 1,25(OH)2 indicates both endogenous  production and supplementation. Vitamin D2, 1,25(OH)2 is  an indicator of exogenous sources, such as diet or  supplementation. Interpretation and therapy are based on  measurement of Vitamin D, 1,25 (OH)2, Total. . This test was developed, and its analytical performance  characteristics have been determined by [employer]. It has not been cleared or approved by the  FDA. This assay has been validated pursuant to the CLIA  regulations and is used for clinical purposes. . For additional information, please refer to http://education.[facility].com/faq/FAQ199 (This link is being provided for  informational/educational purposes only.) . [facility] med fusion [address] [place] [address] [phone] Junita Push L. Teanna Elem Caul, MD, PhD   CBC with Differential/Platelet     Status: Abnormal   Collection Time: 10/13/23  9:34 AM  Result Value Ref Range   WBC 7.1 4.0 -  10.5 K/uL   RBC 3.84 (L) 3.87 - 5.11 Mil/uL   Hemoglobin 11.7 (L) 12.0 - 15.0 g/dL   HCT 30.8 65.7 - 84.6 %   MCV 93.8 78.0 - 100.0 fl   MCHC 32.4 30.0 - 36.0 g/dL   RDW 96.2 (H) 95.2 - 84.1 %   Platelets 311.0 150.0 - 400.0 K/uL   Neutrophils Relative % 61.2 43.0 - 77.0 %   Lymphocytes Relative 26.1 12.0 - 46.0 %   Monocytes Relative 10.4 3.0 - 12.0 %   Eosinophils Relative 1.2 0.0 - 5.0 %   Basophils Relative 1.1 0.0 - 3.0 %   Neutro Abs 4.3 1.4 - 7.7 K/uL   Lymphs Abs 1.9 0.7 - 4.0 K/uL   Monocytes Absolute 0.7 0.1 - 1.0 K/uL   Eosinophils Absolute 0.1 0.0 - 0.7 K/uL   Basophils  Absolute 0.1 0.0 - 0.1 K/uL  Comprehensive metabolic panel     Status: Abnormal   Collection Time: 10/13/23  9:34 AM  Result Value Ref Range   Sodium 142 135 - 145 mEq/L   Potassium 4.0 3.5 - 5.1 mEq/L   Chloride 106 96 - 112 mEq/L   CO2 25 19 - 32 mEq/L   Glucose, Bld 103 (H) 70 - 99 mg/dL   BUN 18 6 - 23 mg/dL   Creatinine, Ser 8.75 0.40 - 1.20 mg/dL   Total Bilirubin 0.4 0.2 - 1.2 mg/dL   Alkaline Phosphatase 73 39 - 117 U/L   AST 23 0 - 37 U/L   ALT 23 0 - 35 U/L   Total Protein 6.9 6.0 - 8.3 g/dL   Albumin 4.0 3.5 - 5.2 g/dL   GFR 64.33 >29.51 mL/min    Comment: Calculated using the CKD-EPI Creatinine Equation (2021)   Calcium 9.2 8.4 - 10.5 mg/dL  Urinalysis w microscopic + reflex cultur     Status: Abnormal   Collection Time: 10/13/23  9:34 AM   Specimen: Blood  Result Value Ref Range   Color, Urine YELLOW YELLOW   APPearance CLEAR CLEAR   Specific Gravity, Urine 1.016 1.001 - 1.035   pH 5.5 5.0 - 8.0   Glucose, UA NEGATIVE NEGATIVE   Bilirubin Urine NEGATIVE NEGATIVE   Ketones, ur NEGATIVE NEGATIVE   Hgb urine dipstick NEGATIVE NEGATIVE   Protein, ur TRACE (A) NEGATIVE   Nitrites, Initial NEGATIVE NEGATIVE   Leukocyte Esterase 1+ (A) NEGATIVE   WBC, UA 6-10 (A) 0 - 5 /HPF   RBC / HPF NONE SEEN 0 - 2 /HPF   Squamous Epithelial / HPF 0-5 < OR = 5 /HPF   Bacteria, UA NONE SEEN  NONE SEEN /HPF   Hyaline Cast 0-5 (A) NONE SEEN /LPF   Note      Comment: This urine was analyzed for the presence of WBC,  RBC, bacteria, casts, and other formed elements.  Only those elements seen were reported. . .   Sedimentation rate     Status: None   Collection Time: 10/13/23  9:34 AM  Result Value Ref Range   Sed Rate 29 0 - 30 mm/hr  CRP High sensitivity     Status: None   Collection Time: 10/13/23  9:34 AM  Result Value Ref Range   CRP, High Sensitivity 1.680 0.000 - 5.000 mg/L    Comment: Note:  An elevated hs-CRP (>5 mg/L) should be repeated after 2 weeks to rule out recent infection or trauma.  Urine Culture     Status: None   Collection Time: 10/13/23  9:34 AM  Result Value Ref Range   MICRO NUMBER: [id number]    SPECIMEN QUALITY: Adequate    Sample Source URINE    STATUS: FINAL    Result: No Growth   REFLEXIVE URINE CULTURE     Status: None   Collection Time: 10/13/23  9:34 AM  Result Value Ref Range   REFLEXIVE URINE CULTURE      Comment: CULTURE INDICATED - RESULTS TO FOLLOW        Garner Nash, MD, MS

## 2023-10-13 NOTE — Assessment & Plan Note (Signed)
New right lower back pain likely sciatica, exacerbated by recent fall.  Plan: Continue Diclofenac Sodium (Voltaren) 1% gel QID PRN. Prescribe Baclofen 10mg  TID as needed for muscle relaxation. Continue Tylenol and topical lidocaine Order lumbar and thoracic spine X-rays. Consider physical therapy if pain persists or worsens. Urinalysis to rule out kidney stones.

## 2023-10-13 NOTE — Patient Instructions (Signed)
-  Take Tylenol as needed for pain relief.  -Apply Salonpas patches to the affected area to help manage pain.  -Follow up with the scheduled X-ray of the lumbar spine and hip at the [facility] office.   [place] [facility] [address], [place], [place] [address] Phone: [phone]  -Consider trying Baclofen as a muscle relaxer for additional pain relief, taking 10mg  up to three times a day as needed.  -Get updated lab work including urinalysis, liver function tests, electrolytes, hemoglobin A1c, and vitamin D levels.

## 2023-10-17 ENCOUNTER — Telehealth: Payer: Self-pay | Admitting: Family Medicine

## 2023-10-17 LAB — URINE CULTURE
MICRO NUMBER:: 15686097
Result:: NO GROWTH
SPECIMEN QUALITY:: ADEQUATE

## 2023-10-17 LAB — URINALYSIS W MICROSCOPIC + REFLEX CULTURE
Bacteria, UA: NONE SEEN /[HPF]
Bilirubin Urine: NEGATIVE
Glucose, UA: NEGATIVE
Hgb urine dipstick: NEGATIVE
Ketones, ur: NEGATIVE
Nitrites, Initial: NEGATIVE
RBC / HPF: NONE SEEN /[HPF] (ref 0–2)
Specific Gravity, Urine: 1.016 (ref 1.001–1.035)
pH: 5.5 (ref 5.0–8.0)

## 2023-10-17 LAB — VITAMIN D 1,25 DIHYDROXY
Vitamin D 1, 25 (OH)2 Total: 32 pg/mL (ref 18–72)
Vitamin D2 1, 25 (OH)2: <8 pg/mL
Vitamin D3 1, 25 (OH)2: 32 pg/mL

## 2023-10-17 LAB — CULTURE INDICATED

## 2023-10-17 NOTE — Telephone Encounter (Signed)
Pt is wanting a cb concerning her most recent x-rays she had done on 10/13/23 when they are released. Please advise pt at [phone]

## 2023-10-17 NOTE — Telephone Encounter (Signed)
I contacted patient and advised her that we currently don't have the results , but they are in process from what I can see. I advised her that we will contact her once we received them

## 2023-10-20 ENCOUNTER — Telehealth: Payer: Self-pay

## 2023-10-20 NOTE — Telephone Encounter (Signed)
Patient called back in about her xray results. I advised her we haven't received them as of yet. I advised her I would reach out to imaging and once we hear back we will advise her about her results.   Spoke with radiology reading room and they stated that they were changing imaging to a stat so that it can be read.

## 2023-10-24 DIAGNOSIS — G8929 Other chronic pain: Secondary | ICD-10-CM | POA: Diagnosis not present

## 2023-10-24 DIAGNOSIS — G250 Essential tremor: Secondary | ICD-10-CM | POA: Diagnosis not present

## 2023-10-24 DIAGNOSIS — M545 Low back pain, unspecified: Secondary | ICD-10-CM | POA: Diagnosis not present

## 2023-10-24 DIAGNOSIS — M5116 Intervertebral disc disorders with radiculopathy, lumbar region: Secondary | ICD-10-CM | POA: Diagnosis not present

## 2023-10-24 DIAGNOSIS — M4802 Spinal stenosis, cervical region: Secondary | ICD-10-CM | POA: Diagnosis not present

## 2023-10-31 ENCOUNTER — Encounter: Payer: Self-pay | Admitting: Cardiology

## 2023-11-03 DIAGNOSIS — H35363 Drusen (degenerative) of macula, bilateral: Secondary | ICD-10-CM | POA: Diagnosis not present

## 2023-11-14 DIAGNOSIS — M3501 Sicca syndrome with keratoconjunctivitis: Secondary | ICD-10-CM | POA: Diagnosis not present

## 2023-11-14 DIAGNOSIS — Z79899 Other long term (current) drug therapy: Secondary | ICD-10-CM | POA: Diagnosis not present

## 2023-11-14 DIAGNOSIS — M1990 Unspecified osteoarthritis, unspecified site: Secondary | ICD-10-CM | POA: Diagnosis not present

## 2023-11-17 ENCOUNTER — Other Ambulatory Visit: Payer: Self-pay

## 2023-11-18 ENCOUNTER — Ambulatory Visit: Payer: Medicare PPO | Admitting: Cardiology

## 2023-11-19 ENCOUNTER — Ambulatory Visit: Payer: Medicare PPO | Attending: Cardiology | Admitting: Cardiology

## 2023-11-19 ENCOUNTER — Encounter: Payer: Self-pay | Admitting: Cardiology

## 2023-11-19 VITALS — BP 120/60 | HR 62 | Ht 63.0 in | Wt 173.1 lb

## 2023-11-19 DIAGNOSIS — I10 Essential (primary) hypertension: Secondary | ICD-10-CM | POA: Diagnosis not present

## 2023-11-19 DIAGNOSIS — I700 Atherosclerosis of aorta: Secondary | ICD-10-CM

## 2023-11-19 DIAGNOSIS — E0800 Diabetes mellitus due to underlying condition with hyperosmolarity without nonketotic hyperglycemic-hyperosmolar coma (NKHHC): Secondary | ICD-10-CM | POA: Diagnosis not present

## 2023-11-19 NOTE — Progress Notes (Signed)
Cardiology Office Note:    Date:  11/19/2023   ID:  Kayla Sampson, DOB [birth year], MRN [id number]  PCP:  Garnette Gunner, MD  Cardiologist:  Garwin Brothers, MD   Referring MD: Garnette Gunner, MD    ASSESSMENT:    1. Aortic atherosclerosis (HCC)   2. Essential hypertension   3. Diabetes mellitus due to underlying condition with hyperosmolarity without coma, without long-term current use of insulin (HCC)    PLAN:    In order of problems listed above:  Coronary artery disease: Secondary prevention stressed with the patient.  Importance of compliance with diet medication stressed and patient verbalized standing. Essential hypertension: Blood pressure stable and diet was emphasized.  Blood pressure was 120 over 60%.  Lifestyle modification urged.  She came here and the rash and drove by herself and it was raining. Mixed dyslipidemia: On lipid-lowering medications followed by primary care. Patient will be seen in follow-up appointment in 6 months or earlier if the patient has any concerns.    Medication Adjustments/Labs and Tests Ordered: Current medicines are reviewed at length with the patient today.  Concerns regarding medicines are outlined above.  No orders of the defined types were placed in this encounter.  No orders of the defined types were placed in this encounter.    No chief complaint on file.    History of Present Illness:    Kayla Sampson is a 77 y.o. female.  Patient has past medical history of coronary artery disease, essential hypertension, mixed dyslipidemia and she ambulates in with a cane because of orthopedic issues.  She leads a sedentary lifestyle.  She mentions to me that she is caring for her husband who has had knee surgery.  No chest pain orthopnea or PND.  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   Aortic atherosclerosis (HCC) 02/04/2023   Benign essential tremor syndrome 05/31/2022   Cardiac murmur 02/04/2023   Cardiomegaly 01/09/2023   Cataract 02/13/2016   Cervical stenosis of spine    Chronic pain of left knee 04/15/2023   Class 1 obesity due to excess calories with serious comorbidity and body mass index (BMI) of 34.0 to 34.9 in adult 11/03/2021   Closed fracture of distal end of right radius 03/06/2016   Closed fracture of right wrist 03/06/2016   COVID-19 07/08/2022   Current mild episode of major depressive disorder without prior episode (HCC) 04/30/2017   Depression 01/29/2023   Diabetes mellitus (HCC) 02/04/2023   Drug therapy 02/13/2016   Dry eye syndrome, bilateral 02/28/2020   Dyspnea on exertion 02/04/2023   Early dry stage nonexudative age-related macular degeneration of both eyes 06/30/2017   Essential hypertension 03/06/2016   Fall at home 03/06/2016   Gait abnormality 02/16/2020   GERD (gastroesophageal reflux disease) 02/13/2016   Grief 04/15/2023   Herpes zoster without complication 06/26/2021   Hiatal hernia    High risk medication use 06/30/2017   Hypertension    Hypothyroidism 01/29/2023   Imbalance 04/08/2016   Increased urinary frequency 01/29/2023   Inflammatory arthritis 02/13/2016   Kidney stone    Laryngeal cyst 05/08/2022   Long-term use of Plaquenil 02/16/2020   Low back pain 05/20/2019   Lung nodule,  multiple 01/29/2023   Microscopic hematuria 01/29/2023   Mixed dyslipidemia 02/04/2023   Neck pain 04/08/2016   Nephrolithiasis 02/16/2020   Nondisplaced fracture of right ulna styloid process, initial encounter for closed fracture 03/06/2016   Normocytic anemia 03/06/2016   Nuclear sclerotic cataract of right eye 07/22/2023   OAB (overactive  bladder) 05/31/2022   Obesity (BMI 30.0-34.9) 02/04/2023   Occasional tremors    Omphalitis in adult 02/13/2016   Osteoarthritis    Osteopenia 02/13/2016   Formatting of this note might be different from the original. actonel started 04/19/15   Prediabetes 05/17/2022   Presbyopia of both eyes 02/13/2016   Recurrent UTI 05/31/2022   Sialoadenitis 01/29/2023   Sjogren syndrome with other organ involvement (HCC) 05/31/2022   Sjogren's syndrome with keratoconjunctivitis sicca (HCC) 02/13/2016   SOB (shortness of breath) on exertion 04/01/2017   Spinal stenosis, cervical region 02/13/2016   Squamous 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 eyes 02/28/2020   Trigeminal neuralgia pain    Vitreous floater, bilateral 11/30/2018    Past Surgical History:  Procedure Laterality Date   ABDOMINAL HYSTERECTOMY     ANKLE FRACTURE SURGERY     ANTERIOR CERVICAL DECOMP/DISCECTOMY FUSION     THYROGLOSSAL DUCT CYST     TUBAL LIGATION      Current Medications: Current Meds  Medication Sig   amlodipine-olmesartan (AZOR) 10-20 MG tablet TAKE 1 TABLET BY MOUTH DAILY   baclofen (LIORESAL) 10 MG tablet Take 1 tablet (10 mg total) by mouth 3 (three) times daily.   diclofenac Sodium (VOLTAREN) 1 % GEL Apply 4 g topically 4 (four) times daily as needed.   DULoxetine (CYMBALTA) 60 MG capsule TAKE 1 CAPSULE(60 MG) BY MOUTH DAILY   folic acid (FOLVITE) 1 MG tablet Take 1 tablet by mouth daily.   hydroxychloroquine (PLAQUENIL) 200 MG tablet Take 200 mg by mouth 2 (two) times daily.   metFORMIN (GLUCOPHAGE-XR) 500 MG 24 hr tablet TAKE 1 TABLET(500 MG) BY MOUTH DAILY   methotrexate (RHEUMATREX) 2.5 MG tablet Take 7 tablets by mouth once a week.   metoprolol succinate (TOPROL-XL) 25 MG 24 hr tablet TAKE 1 TABLET(25 MG) BY MOUTH DAILY   Multiple Vitamins-Minerals (PRESERVISION AREDS 2) CAPS Take 1 tablet by mouth 2 (two) times daily.   MYRBETRIQ 25 MG TB24 tablet Take 25 mg by mouth daily.   rosuvastatin  (CRESTOR) 10 MG tablet Take 1 tablet (10 mg total) by mouth daily.     Allergies:   Carbamazepine, Lisinopril, and Sulfamethoxazole   Social History   Socioeconomic History   Marital status: Married    Spouse name: Not on file   Number of children: Not on file   Years of education: Not on file   Highest education level: Some college, no degree  Occupational History   Not on file  Tobacco Use   Smoking status: Never   Smokeless tobacco: Not on file  Vaping Use   Vaping status: Never Used  Substance and Sexual Activity   Alcohol use: No   Drug use: No   Sexual activity: Not on file  Other Topics Concern   Not on file  Social History Narrative   Not on file   Social Determinants of Health   Financial Resource Strain: Low Risk  (10/10/2023)   Overall Financial Resource Strain (CARDIA)    Difficulty of Paying Living Expenses: Not hard at all  Food Insecurity: No Food Insecurity (10/10/2023)   Hunger Vital Sign    Worried About  Running Out of Food in the Last Year: Never true    Ran Out of Food in the Last Year: Never true  Transportation Needs: No Transportation Needs (10/10/2023)   PRAPARE - Administrator, Civil Service (Medical): No    Lack of Transportation (Non-Medical): No  Physical Activity: Unknown (10/10/2023)   Exercise Vital Sign    Days of Exercise per Week: 0 days    Minutes of Exercise per Session: Not on file  Stress: Stress Concern Present (10/10/2023)   [employer] of Occupational Health - Occupational Stress Questionnaire    Feeling of Stress : To some extent  Social Connections: Moderately Isolated (10/10/2023)   Social Connection and Isolation Panel [NHANES]    Frequency of Communication with Friends and Family: Three times a week    Frequency of Social Gatherings with Friends and Family: Once a week    Attends Religious Services: Never    Database administrator or Organizations: No    Attends Engineer, structural: Not on file     Marital Status: Married     Family History: The patient's family history includes Cancer in her paternal grandfather; Diabetes in her father; Heart attack in her father; Hypertension in her father and mother.  ROS:   Please see the history of present illness.    All other systems reviewed and are negative.  EKGs/Labs/Other Studies Reviewed:    The following studies were reviewed today: I discussed my findings with the patient at length   Recent Labs: 10/13/2023: ALT 23; BUN 18; Creatinine, Ser 0.73; Hemoglobin 11.7; Platelets 311.0; Potassium 4.0; Sodium 142; TSH 2.34  Recent Lipid Panel    Component Value Date/Time   CHOL 121 10/13/2023 0934   TRIG 109.0 10/13/2023 0934   HDL 63.40 10/13/2023 0934   CHOLHDL 2 10/13/2023 0934   VLDL 21.8 10/13/2023 0934   LDLCALC 36 10/13/2023 0934    Physical Exam:    VS:  BP 120/60   Pulse 62   Ht 5\' 3"  (1.6 m)   Wt 173 lb 1.3 oz (78.5 kg)   SpO2 94%   BMI 30.66 kg/m     Wt Readings from Last 3 Encounters:  11/19/23 173 lb 1.3 oz (78.5 kg)  10/13/23 173 lb 3.2 oz (78.6 kg)  05/09/23 179 lb 9.6 oz (81.5 kg)     GEN: Patient is in no acute distress HEENT: Normal NECK: No JVD; No carotid bruits LYMPHATICS: No lymphadenopathy CARDIAC: Hear sounds regular, 2/6 systolic murmur at the apex. RESPIRATORY:  Clear to auscultation without rales, wheezing or rhonchi  ABDOMEN: Soft, non-tender, non-distended MUSCULOSKELETAL:  No edema; No deformity  SKIN: Warm and dry NEUROLOGIC:  Alert and oriented x 3 PSYCHIATRIC:  Normal affect   Signed, Garwin Brothers, MD  11/19/2023 10:55 AM    [facility]

## 2023-11-19 NOTE — Patient Instructions (Signed)

## 2023-11-22 ENCOUNTER — Other Ambulatory Visit: Payer: Self-pay | Admitting: Family Medicine

## 2023-11-22 DIAGNOSIS — I517 Cardiomegaly: Secondary | ICD-10-CM

## 2023-11-22 DIAGNOSIS — I10 Essential (primary) hypertension: Secondary | ICD-10-CM

## 2023-11-22 DIAGNOSIS — I700 Atherosclerosis of aorta: Secondary | ICD-10-CM

## 2023-11-26 ENCOUNTER — Other Ambulatory Visit: Payer: Self-pay | Admitting: Family Medicine

## 2023-11-26 DIAGNOSIS — G250 Essential tremor: Secondary | ICD-10-CM

## 2023-11-26 DIAGNOSIS — I10 Essential (primary) hypertension: Secondary | ICD-10-CM

## 2023-11-27 DIAGNOSIS — H2511 Age-related nuclear cataract, right eye: Secondary | ICD-10-CM | POA: Diagnosis not present

## 2023-11-27 DIAGNOSIS — H25811 Combined forms of age-related cataract, right eye: Secondary | ICD-10-CM | POA: Diagnosis not present

## 2023-12-08 DIAGNOSIS — H25012 Cortical age-related cataract, left eye: Secondary | ICD-10-CM | POA: Diagnosis not present

## 2023-12-08 DIAGNOSIS — H2512 Age-related nuclear cataract, left eye: Secondary | ICD-10-CM | POA: Diagnosis not present

## 2023-12-18 DIAGNOSIS — H25812 Combined forms of age-related cataract, left eye: Secondary | ICD-10-CM | POA: Diagnosis not present

## 2023-12-18 DIAGNOSIS — H2512 Age-related nuclear cataract, left eye: Secondary | ICD-10-CM | POA: Diagnosis not present

## 2023-12-30 DIAGNOSIS — M3501 Sicca syndrome with keratoconjunctivitis: Secondary | ICD-10-CM | POA: Diagnosis not present

## 2023-12-30 DIAGNOSIS — Z79899 Other long term (current) drug therapy: Secondary | ICD-10-CM | POA: Diagnosis not present

## 2023-12-30 DIAGNOSIS — M1990 Unspecified osteoarthritis, unspecified site: Secondary | ICD-10-CM | POA: Diagnosis not present

## 2024-01-20 DIAGNOSIS — H524 Presbyopia: Secondary | ICD-10-CM | POA: Diagnosis not present

## 2024-01-20 DIAGNOSIS — H5213 Myopia, bilateral: Secondary | ICD-10-CM | POA: Diagnosis not present

## 2024-01-20 DIAGNOSIS — H52203 Unspecified astigmatism, bilateral: Secondary | ICD-10-CM | POA: Diagnosis not present

## 2024-01-21 DIAGNOSIS — D235 Other benign neoplasm of skin of trunk: Secondary | ICD-10-CM | POA: Diagnosis not present

## 2024-01-21 DIAGNOSIS — D225 Melanocytic nevi of trunk: Secondary | ICD-10-CM | POA: Diagnosis not present

## 2024-01-21 DIAGNOSIS — L720 Epidermal cyst: Secondary | ICD-10-CM | POA: Diagnosis not present

## 2024-01-21 DIAGNOSIS — L814 Other melanin hyperpigmentation: Secondary | ICD-10-CM | POA: Diagnosis not present

## 2024-01-21 DIAGNOSIS — L579 Skin changes due to chronic exposure to nonionizing radiation, unspecified: Secondary | ICD-10-CM | POA: Diagnosis not present

## 2024-01-21 DIAGNOSIS — L820 Inflamed seborrheic keratosis: Secondary | ICD-10-CM | POA: Diagnosis not present

## 2024-01-21 DIAGNOSIS — L821 Other seborrheic keratosis: Secondary | ICD-10-CM | POA: Diagnosis not present

## 2024-02-23 ENCOUNTER — Other Ambulatory Visit: Payer: Self-pay | Admitting: Family Medicine

## 2024-03-23 DIAGNOSIS — M545 Low back pain, unspecified: Secondary | ICD-10-CM | POA: Diagnosis not present

## 2024-03-23 DIAGNOSIS — M4802 Spinal stenosis, cervical region: Secondary | ICD-10-CM | POA: Diagnosis not present

## 2024-03-23 DIAGNOSIS — G8929 Other chronic pain: Secondary | ICD-10-CM | POA: Diagnosis not present

## 2024-04-06 DIAGNOSIS — M1990 Unspecified osteoarthritis, unspecified site: Secondary | ICD-10-CM | POA: Diagnosis not present

## 2024-04-06 DIAGNOSIS — M3501 Sicca syndrome with keratoconjunctivitis: Secondary | ICD-10-CM | POA: Diagnosis not present

## 2024-04-06 DIAGNOSIS — Z79899 Other long term (current) drug therapy: Secondary | ICD-10-CM | POA: Diagnosis not present

## 2024-04-12 ENCOUNTER — Ambulatory Visit: Payer: Medicare PPO | Admitting: Family Medicine

## 2024-04-15 ENCOUNTER — Ambulatory Visit: Admitting: Family Medicine

## 2024-04-15 ENCOUNTER — Encounter: Payer: Self-pay | Admitting: Family Medicine

## 2024-04-15 VITALS — BP 137/61 | HR 60 | Temp 97.0°F | Resp 18 | Wt 180.6 lb

## 2024-04-15 DIAGNOSIS — M3501 Sicca syndrome with keratoconjunctivitis: Secondary | ICD-10-CM

## 2024-04-15 DIAGNOSIS — F3342 Major depressive disorder, recurrent, in full remission: Secondary | ICD-10-CM | POA: Diagnosis not present

## 2024-04-15 DIAGNOSIS — I10 Essential (primary) hypertension: Secondary | ICD-10-CM

## 2024-04-15 DIAGNOSIS — E66811 Obesity, class 1: Secondary | ICD-10-CM | POA: Diagnosis not present

## 2024-04-15 DIAGNOSIS — E1122 Type 2 diabetes mellitus with diabetic chronic kidney disease: Secondary | ICD-10-CM

## 2024-04-15 DIAGNOSIS — M8589 Other specified disorders of bone density and structure, multiple sites: Secondary | ICD-10-CM

## 2024-04-15 DIAGNOSIS — H2513 Age-related nuclear cataract, bilateral: Secondary | ICD-10-CM | POA: Diagnosis not present

## 2024-04-15 DIAGNOSIS — Z6834 Body mass index (BMI) 34.0-34.9, adult: Secondary | ICD-10-CM

## 2024-04-15 DIAGNOSIS — E6609 Other obesity due to excess calories: Secondary | ICD-10-CM

## 2024-04-15 DIAGNOSIS — E782 Mixed hyperlipidemia: Secondary | ICD-10-CM | POA: Diagnosis not present

## 2024-04-15 DIAGNOSIS — I700 Atherosclerosis of aorta: Secondary | ICD-10-CM

## 2024-04-15 DIAGNOSIS — E0936 Drug or chemical induced diabetes mellitus with diabetic cataract: Secondary | ICD-10-CM

## 2024-04-15 DIAGNOSIS — N183 Chronic kidney disease, stage 3 unspecified: Secondary | ICD-10-CM | POA: Insufficient documentation

## 2024-04-15 HISTORY — DX: Type 2 diabetes mellitus with diabetic chronic kidney disease: E11.22

## 2024-04-15 LAB — POCT GLYCOSYLATED HEMOGLOBIN (HGB A1C): Hemoglobin A1C: 6 % — AB (ref 4.0–5.6)

## 2024-04-15 MED ORDER — DULOXETINE HCL 60 MG PO CPEP
120.0000 mg | ORAL_CAPSULE | Freq: Every day | ORAL | 3 refills | Status: DC
Start: 2024-04-15 — End: 2024-04-30

## 2024-04-15 NOTE — Patient Instructions (Signed)
  VISIT SUMMARY: Today, you came in for a follow-up appointment to manage your diabetes, hypertension, chronic lower back pain, depression, and other health conditions. We reviewed your current medications and made some adjustments to better control your symptoms and improve your quality of life.  YOUR PLAN: -CHRONIC LOWER BACK PAIN: Chronic lower back pain is persistent pain in the lower back area. We will discontinue baclofen  as it is not effective for you and increase your duloxetine  to 120 mg daily, which can be taken either as 120 mg once daily or 60 mg twice daily, to help with pain relief.  -DEPRESSION: Depression is a mood disorder that causes persistent feelings of sadness and loss of interest. We will increase your duloxetine  to 120 mg daily, either as 120 mg once daily or 60 mg twice daily, to help manage both your depression and chronic pain. Please monitor for any side effects from the increased dosage.  -TYPE 2 DIABETES MELLITUS: Type 2 diabetes is a condition that affects the way your body processes blood sugar. Your diabetes is well-controlled with an A1c of 6.0%. Continue taking metformin  500 mg daily. We will also order an eye exam to screen for diabetic retinopathy, a complication of diabetes that affects the eyes.  -ESSENTIAL HYPERTENSION: Hypertension is high blood pressure. Your blood pressure is well-controlled at 137/61 mmHg. Continue taking amlodipine  10-20 mg daily and metoprolol  25 mg daily.  -HYPERLIPIDEMIA: Hyperlipidemia is having high levels of fats in the blood. Continue taking rosuvastatin  10 mg daily to manage your cholesterol levels.  -STAGE 3 CHRONIC KIDNEY DISEASE: Chronic kidney disease is a condition where the kidneys gradually lose function. We will continue to monitor your kidney function.  -OBESITY: Obesity is having an excessive amount of body fat, with a BMI of 32. We recommend maintaining a healthy diet and regular exercise to manage your  weight.  -KERATOCONJUNCTIVITIS SICCA: Keratoconjunctivitis sicca is dry eye syndrome, often associated with Sjogren's syndrome. Continue managing your symptoms with methotrexate and taking naps to alleviate fatigue.  -NUCLEAR SCLEROTIC CATARACT: A nuclear sclerotic cataract is a clouding of the eye's lens. You have had cataract surgery, which has improved your vision.  -OSTEOPENIA: Osteopenia is a condition where bone density is lower than normal. We will order a DEXA scan to assess your current bone density.  INSTRUCTIONS: Please schedule an eye exam for diabetic retinopathy screening and a DEXA scan to assess your bone density. Continue taking your medications as prescribed and monitor for any side effects from the increased duloxetine  dosage. Follow a healthy diet and regular exercise routine to manage your weight. We will continue to monitor your kidney function and overall health.

## 2024-04-15 NOTE — Progress Notes (Signed)
 Assessment & Plan   Assessment/Plan:    Assessment and Plan Assessment & Plan Chronic pain due to chronic lower back pain Chronic lower back pain managed with duloxetine  and baclofen . Baclofen  not effective, duloxetine  to be increased for additional pain relief. - Discontinue baclofen . - Increase duloxetine  to 120 mg daily, either as 120 mg once daily or 60 mg twice daily.  Depression, unspecified Depression managed with duloxetine  60 mg daily. Considering increasing dose for additional pain relief. She has not experienced noticeable side effects from current dosage. - Increase duloxetine  to 120 mg daily, either as 120 mg once daily or 60 mg twice daily. - Monitor for side effects of increased duloxetine  dosage.  Type 2 diabetes mellitus without complications Type 2 diabetes mellitus is well-controlled with an A1c of 6.5%. Managed with metformin  500 mg daily. - Continue metformin  500 mg daily. - Order eye exam for diabetic retinopathy screening.  Essential (primary) hypertension Blood pressure is well-controlled at 137/61 mmHg. Managed with amlodipine  and metoprolol . - Continue amlodipine  10-20 mg daily. - Continue metoprolol  25 mg daily.  Hyperlipidemia, unspecified Hyperlipidemia managed with rosuvastatin  10 mg daily. - Continue rosuvastatin  10 mg daily.  Stage 3 chronic kidney disease Stage 3 chronic kidney disease.  Obesity, unspecified Obesity with a BMI of 32.  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 Managed with methotrexate, which has improved symptoms but not completely resolved them. She manages fatigue with naps.  Nuclear sclerotic cataract, right eye Nuclear sclerotic cataract in the right eye. She has had cataract surgery with improved vision.  Osteopenia Osteopenia with chronic steroid use. Last DEXA scan in 2022 showed low bone mass. Due for a follow-up scan. - Order DEXA scan to  assess current bone density.      Medications Discontinued During This Encounter  Medication Reason   baclofen  (LIORESAL ) 10 MG tablet    DULoxetine  (CYMBALTA ) 60 MG capsule     Return in about 6 months (around 10/16/2024) for DM, fasting labs.        Subjective:   Encounter date: 04/15/2024  Kayla Sampson is a 78 y.o. female who has Allergic rhinitis; Current mild episode of major depressive disorder without prior episode (HCC); GERD (gastroesophageal reflux disease); Inflammatory arthritis; Omphalitis in adult; Prediabetes; Cataract; Nephrolithiasis; Essential hypertension; Sjogren syndrome with other organ involvement (HCC); OAB (overactive bladder); Recurrent UTI; Hiatal hernia; Benign essential tremor syndrome; COVID-19; Cardiomegaly; Cervical stenosis of spine; Hypertension; Kidney stone; Occasional tremors; Osteoarthritis; Trigeminal neuralgia pain; Actinic keratosis; Anterior basement membrane dystrophy; Class 1 obesity due to excess calories with serious comorbidity and body mass index (BMI) of 34.0 to 34.9 in adult; Closed fracture of distal end of right radius; Closed fracture of right wrist; Depression; Drug therapy; Dry eye syndrome, bilateral; Early dry stage nonexudative age-related macular degeneration of both eyes; Fall at home; Gait abnormality; Herpes zoster without complication; High risk medication use; Hypothyroidism; Imbalance; Increased urinary frequency; Laryngeal cyst; Long-term use of Plaquenil; Low back pain; Lung nodule, multiple; Microscopic hematuria; Nondisplaced fracture of right ulna styloid process, initial encounter for closed fracture; Normocytic anemia; Osteopenia; Presbyopia of both eyes; SOB (shortness of breath) on exertion; Squamous 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 eyes; Vitreous floater, bilateral; Spinal stenosis, cervical region; Sialoadenitis; Acute kidney injury (HCC); Sjogren's syndrome with keratoconjunctivitis sicca (HCC); Mixed  hyperlipidemia; Diabetes mellitus (HCC); Obesity (BMI 30.0-34.9); Aortic atherosclerosis (HCC); Dyspnea on exertion; Cardiac murmur; Grief; Chronic pain of left knee; Age-related osteoporosis without current pathological fracture;  Acute right-sided thoracic back pain; Age-related nuclear cataract, bilateral; Nuclear sclerotic cataract of right eye; and CKD stage 3 due to type 2 diabetes mellitus (HCC) on their problem list..   She  has a past medical history of Actinic keratosis (02/13/2016), Acute kidney injury (HCC) (05/05/2021), Acute right-sided thoracic back pain (10/13/2023), Age-related nuclear cataract, bilateral (02/13/2016), Age-related osteoporosis without current pathological fracture (10/13/2023), Allergic rhinitis (02/13/2016), Anterior basement membrane dystrophy (02/28/2020), Aortic atherosclerosis (HCC) (02/04/2023), Benign essential tremor syndrome (05/31/2022), Cardiac murmur (02/04/2023), Cardiomegaly (01/09/2023), Cataract (02/13/2016), Cervical stenosis of spine, Chronic pain of left knee (04/15/2023), Class 1 obesity due to excess calories with serious comorbidity and body mass index (BMI) of 34.0 to 34.9 in adult (11/03/2021), Closed fracture of distal end of right radius (03/06/2016), Closed fracture of right wrist (03/06/2016), COVID-19 (07/08/2022), Current mild episode of major depressive disorder without prior episode (HCC) (04/30/2017), Depression (01/29/2023), Diabetes mellitus (HCC) (02/04/2023), Drug therapy (02/13/2016), Dry eye syndrome, bilateral (02/28/2020), Dyspnea on exertion (02/04/2023), Early dry stage nonexudative age-related macular degeneration of both eyes (06/30/2017), Essential hypertension (03/06/2016), Fall at home (03/06/2016), Gait abnormality (02/16/2020), GERD (gastroesophageal reflux disease) (02/13/2016), Grief (04/15/2023), Herpes zoster without complication (06/26/2021), Hiatal hernia, High risk medication use (06/30/2017), Hypertension, Hypothyroidism  (01/29/2023), Imbalance (04/08/2016), Increased urinary frequency (01/29/2023), Inflammatory arthritis (02/13/2016), Kidney stone, Laryngeal cyst (05/08/2022), Long-term use of Plaquenil (02/16/2020), Low back pain (05/20/2019), Lung nodule, multiple (01/29/2023), Microscopic hematuria (01/29/2023), Mixed dyslipidemia (02/04/2023), Neck pain (04/08/2016), Nephrolithiasis (02/16/2020), Nondisplaced fracture of right ulna styloid process, initial encounter for closed fracture (03/06/2016), Normocytic anemia (03/06/2016), Nuclear sclerotic cataract of right eye (07/22/2023), OAB (overactive bladder) (05/31/2022), Obesity (BMI 30.0-34.9) (02/04/2023), Occasional tremors, Omphalitis in adult (02/13/2016), Osteoarthritis, Osteopenia (02/13/2016), Prediabetes (05/17/2022), Presbyopia of both eyes (02/13/2016), Recurrent UTI (05/31/2022), Sialoadenitis (01/29/2023), Sjogren syndrome with other organ involvement (HCC) (05/31/2022), Sjogren's syndrome with keratoconjunctivitis sicca (HCC) (02/13/2016), SOB (shortness of breath) on exertion (04/01/2017), Spinal stenosis, cervical region (02/13/2016), Squamous 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   She presents with chief complaint of Medical Management of Chronic Issues (6 month follow up DM and back pain. //Hm due- eye exam (schedule in few months) ) and Back Pain (Pt stated back pain has improved; seen neurology on 03/23/2024 at [facility]; received injections with possible bulging disk Blain Bulls, Lirim, MD) ) .   Discussed the use of AI scribe software for clinical note transcription with the patient, who gave verbal consent to proceed.  History of Present Illness RILYA POWNELL is a 78 year old female with diabetes and hypertension who presents for follow-up.  Her diabetes is well-managed with a current A1c of 6.0, and she takes metformin  500 mg daily. Her  hypertension is controlled with amlodipine  10-20 mg daily and metoprolol  25 mg daily. She also manages hyperlipidemia with rosuvastatin  10 mg daily.  She has a history of chronic lower back pain and depression. Her depression is managed with duloxetine  60 mg daily, which also aids in managing her back pain. Baclofen  10 mg TID as needed for muscle spasms is not very effective, and she uses diclofenac  gel 4 mg four times daily for back and arthritic joint pain.  She has Sjogren's syndrome, which causes exhaustion, somewhat alleviated by methotrexate, though she still experiences fatigue and manages it with naps. She has keratoconjunctivitis sicca, bilateral vitreous floaters, and a nuclear sclerotic cataract in the right eye. She mentions needing an eye exam for diabetic retinopathy screening. Her vision  improved after cataract surgery.  She has a history of osteopenia and chronic kidney disease stage 3E. She recalls taking a monthly tablet for osteopenia for a limited time and has a history of chronic steroid use, which can affect bone density, including past steroid injections.  No chest pain, shortness of breath, or excessive thirst.     ROS  Past Surgical History:  Procedure Laterality Date   ABDOMINAL HYSTERECTOMY     ANKLE FRACTURE SURGERY     ANTERIOR CERVICAL DECOMP/DISCECTOMY FUSION     THYROGLOSSAL DUCT CYST     TUBAL LIGATION      Outpatient Medications Prior to Visit  Medication Sig Dispense Refill   amlodipine -olmesartan  (AZOR ) 10-20 MG tablet TAKE 1 TABLET BY MOUTH DAILY 90 tablet 0   diclofenac  Sodium (VOLTAREN ) 1 % GEL Apply 4 g topically 4 (four) times daily as needed. 100 g 3   folic acid (FOLVITE) 1 MG tablet Take 1 tablet by mouth daily.     hydroxychloroquine (PLAQUENIL) 200 MG tablet Take 200 mg by mouth 2 (two) times daily.     metFORMIN  (GLUCOPHAGE -XR) 500 MG 24 hr tablet TAKE 1 TABLET(500 MG) BY MOUTH DAILY 90 tablet 2   methotrexate (RHEUMATREX) 2.5 MG tablet  Take 7 tablets by mouth once a week.     metoprolol  succinate (TOPROL -XL) 25 MG 24 hr tablet TAKE 1 TABLET(25 MG) BY MOUTH DAILY 90 tablet 1   Multiple Vitamins-Minerals (PRESERVISION AREDS 2) CAPS Take 1 tablet by mouth 2 (two) times daily.     MYRBETRIQ 25 MG TB24 tablet Take 25 mg by mouth daily.     rosuvastatin  (CRESTOR ) 10 MG tablet TAKE 1 TABLET(10 MG) BY MOUTH DAILY 90 tablet 3   baclofen  (LIORESAL ) 10 MG tablet Take 1 tablet (10 mg total) by mouth 3 (three) times daily. 30 each 0   DULoxetine  (CYMBALTA ) 60 MG capsule TAKE 1 CAPSULE(60 MG) BY MOUTH DAILY 90 capsule 3   No facility-administered medications prior to visit.    Family History  Problem Relation Age of Onset   Hypertension Mother    Heart attack Father    Hypertension Father    Diabetes Father    Cancer Paternal Grandfather     Social History   Socioeconomic History   Marital status: Married    Spouse name: Not on file   Number of children: Not on file   Years of education: Not on file   Highest education level: 12th grade  Occupational History   Not on file  Tobacco Use   Smoking status: Never   Smokeless tobacco: Not on file  Vaping Use   Vaping status: Never Used  Substance and Sexual Activity   Alcohol use: No   Drug use: No   Sexual activity: Not on file  Other Topics Concern   Not on file  Social History Narrative   Not on file   Social Drivers of Health   Financial Resource Strain: Low Risk  (04/12/2024)   Overall Financial Resource Strain (CARDIA)    Difficulty of Paying Living Expenses: Not very hard  Food Insecurity: No Food Insecurity (04/12/2024)   Hunger Vital Sign    Worried About Running Out of Food in the Last Year: Never true    Ran Out of Food in the Last Year: Never true  Transportation Needs: No Transportation Needs (04/12/2024)   PRAPARE - Administrator, Civil Service (Medical): No    Lack of Transportation (Non-Medical): No  Physical  Activity: Unknown (04/12/2024)    Exercise Vital Sign    Days of Exercise per Week: 2 days    Minutes of Exercise per Session: Not on file  Stress: No Stress Concern Present (04/12/2024)   [employer] of Occupational Health - Occupational Stress Questionnaire    Feeling of Stress : Only a little  Social Connections: Moderately Isolated (04/12/2024)   Social Connection and Isolation Panel [NHANES]    Frequency of Communication with Friends and Family: More than three times a week    Frequency of Social Gatherings with Friends and Family: Once a week    Attends Religious Services: Never    Database administrator or Organizations: No    Attends Engineer, structural: Not on file    Marital Status: Married  Intimate Partner Violence: Unknown (10/22/2022)   Received from [employer], [facility]   HITS    Physically Hurt: Not on file    Insult or Talk Down To: Not on file    Threaten Physical Harm: Not on file    Scream or Curse: Not on file                                                                                                  Objective:  Physical Exam: BP 137/61 (BP Location: Left Arm, Patient Position: Sitting, Cuff Size: Large)   Pulse 60   Temp (!) 97 F (36.1 C) (Temporal)   Resp 18   Wt 180 lb 9.6 oz (81.9 kg)   SpO2 98%   BMI 31.99 kg/m    Physical Exam VITALS: P- 60, BP- 137/61, SaO2- 98% MEASUREMENTS: BMI- 32.0. GENERAL: Alert, cooperative, well developed, no acute distress HEENT: Normocephalic, normal oropharynx, moist mucous membranes CHEST: Clear to auscultation bilaterally, no wheezes, rhonchi, or crackles CARDIOVASCULAR: Normal heart rate and rhythm, S1 and S2 normal without murmurs ABDOMEN: Soft, non-tender, non-distended, without organomegaly, normal bowel sounds EXTREMITIES: No cyanosis or edema NEUROLOGICAL: Cranial nerves grossly intact, moves all extremities without gross motor or sensory deficit   Physical Exam  No results found.  No results found for  this or any previous visit (from the past 2160 hours).      Carnell Christian, MD, MS

## 2024-04-28 ENCOUNTER — Encounter: Payer: Self-pay | Admitting: Family Medicine

## 2024-04-28 DIAGNOSIS — F3342 Major depressive disorder, recurrent, in full remission: Secondary | ICD-10-CM

## 2024-04-30 MED ORDER — DULOXETINE HCL 60 MG PO CPEP: 60.0000 mg | ORAL_CAPSULE | Freq: Every day | ORAL | 3 refills | Status: AC

## 2024-05-21 ENCOUNTER — Other Ambulatory Visit: Payer: Self-pay | Admitting: Family Medicine

## 2024-05-21 DIAGNOSIS — G250 Essential tremor: Secondary | ICD-10-CM

## 2024-05-21 DIAGNOSIS — I10 Essential (primary) hypertension: Secondary | ICD-10-CM

## 2024-06-03 ENCOUNTER — Other Ambulatory Visit: Payer: Self-pay | Admitting: Family Medicine

## 2024-06-03 ENCOUNTER — Encounter: Payer: Self-pay | Admitting: Family Medicine

## 2024-06-03 DIAGNOSIS — Z1231 Encounter for screening mammogram for malignant neoplasm of breast: Secondary | ICD-10-CM

## 2024-06-04 DIAGNOSIS — N3001 Acute cystitis with hematuria: Secondary | ICD-10-CM | POA: Diagnosis not present

## 2024-06-04 DIAGNOSIS — Z87440 Personal history of urinary (tract) infections: Secondary | ICD-10-CM | POA: Diagnosis not present

## 2024-06-04 DIAGNOSIS — R300 Dysuria: Secondary | ICD-10-CM | POA: Diagnosis not present

## 2024-06-29 ENCOUNTER — Emergency Department (HOSPITAL_COMMUNITY)

## 2024-06-29 ENCOUNTER — Encounter (HOSPITAL_COMMUNITY): Payer: Self-pay | Admitting: Emergency Medicine

## 2024-06-29 ENCOUNTER — Emergency Department (HOSPITAL_COMMUNITY)
Admission: EM | Admit: 2024-06-29 | Discharge: 2024-06-29 | Disposition: A | Discharge status: Home / Self Care | Source: Ambulatory Visit | Attending: Emergency Medicine | Admitting: Emergency Medicine

## 2024-06-29 ENCOUNTER — Other Ambulatory Visit: Payer: Self-pay

## 2024-06-29 DIAGNOSIS — I10 Essential (primary) hypertension: Secondary | ICD-10-CM | POA: Insufficient documentation

## 2024-06-29 DIAGNOSIS — E039 Hypothyroidism, unspecified: Secondary | ICD-10-CM | POA: Diagnosis not present

## 2024-06-29 DIAGNOSIS — M542 Cervicalgia: Secondary | ICD-10-CM | POA: Insufficient documentation

## 2024-06-29 DIAGNOSIS — E041 Nontoxic single thyroid nodule: Secondary | ICD-10-CM | POA: Insufficient documentation

## 2024-06-29 DIAGNOSIS — R42 Dizziness and giddiness: Secondary | ICD-10-CM

## 2024-06-29 DIAGNOSIS — D649 Anemia, unspecified: Secondary | ICD-10-CM | POA: Diagnosis not present

## 2024-06-29 DIAGNOSIS — G8929 Other chronic pain: Secondary | ICD-10-CM | POA: Insufficient documentation

## 2024-06-29 DIAGNOSIS — R479 Unspecified speech disturbances: Secondary | ICD-10-CM | POA: Diagnosis not present

## 2024-06-29 DIAGNOSIS — I080 Rheumatic disorders of both mitral and aortic valves: Secondary | ICD-10-CM

## 2024-06-29 DIAGNOSIS — I6523 Occlusion and stenosis of bilateral carotid arteries: Secondary | ICD-10-CM | POA: Diagnosis not present

## 2024-06-29 DIAGNOSIS — Z8616 Personal history of COVID-19: Secondary | ICD-10-CM | POA: Insufficient documentation

## 2024-06-29 HISTORY — DX: Rheumatic disorders of both mitral and aortic valves: I08.0

## 2024-06-29 LAB — COMPREHENSIVE METABOLIC PANEL WITH GFR
ALT: 15 U/L (ref 0–44)
AST: 18 U/L (ref 15–41)
Albumin: 3.4 g/dL — ABNORMAL LOW (ref 3.5–5.0)
Alkaline Phosphatase: 59 U/L (ref 38–126)
Anion gap: 8 (ref 5–15)
BUN: 25 mg/dL — ABNORMAL HIGH (ref 8–23)
CO2: 23 mmol/L (ref 22–32)
Calcium: 8.6 mg/dL — ABNORMAL LOW (ref 8.9–10.3)
Chloride: 107 mmol/L (ref 98–111)
Creatinine, Ser: 0.68 mg/dL (ref 0.44–1.00)
GFR, Estimated: >60 mL/min (ref >60)
Glucose, Bld: 165 mg/dL — ABNORMAL HIGH (ref 70–99)
Potassium: 3.9 mmol/L (ref 3.5–5.1)
Sodium: 138 mmol/L (ref 135–145)
Total Bilirubin: 0.6 mg/dL (ref 0.0–1.2)
Total Protein: 6.6 g/dL (ref 6.5–8.1)

## 2024-06-29 LAB — CBC WITH DIFFERENTIAL/PLATELET
Abs Immature Granulocytes: 0.02 K/uL (ref 0.00–0.07)
Basophils Absolute: 0.1 K/uL (ref 0.0–0.1)
Basophils Relative: 1 %
Eosinophils Absolute: 0.1 K/uL (ref 0.0–0.5)
Eosinophils Relative: 1 %
HCT: 34.5 % — ABNORMAL LOW (ref 36.0–46.0)
Hemoglobin: 11 g/dL — ABNORMAL LOW (ref 12.0–15.0)
Immature Granulocytes: 0 %
Lymphocytes Relative: 33 %
Lymphs Abs: 2.2 K/uL (ref 0.7–4.0)
MCH: 30.6 pg (ref 26.0–34.0)
MCHC: 31.9 g/dL (ref 30.0–36.0)
MCV: 96.1 fL (ref 80.0–100.0)
Monocytes Absolute: 0.8 K/uL (ref 0.1–1.0)
Monocytes Relative: 12 %
Neutro Abs: 3.6 K/uL (ref 1.7–7.7)
Neutrophils Relative %: 53 %
Platelets: 257 K/uL (ref 150–400)
RBC: 3.59 MIL/uL — ABNORMAL LOW (ref 3.87–5.11)
RDW: 14.6 % (ref 11.5–15.5)
WBC: 6.7 K/uL (ref 4.0–10.5)
nRBC: 0 % (ref 0.0–0.2)

## 2024-06-29 LAB — RAPID URINE DRUG SCREEN, HOSP PERFORMED
Amphetamines: NOT DETECTED
Barbiturates: NOT DETECTED
Benzodiazepines: NOT DETECTED
Cocaine: NOT DETECTED
Opiates: NOT DETECTED
Tetrahydrocannabinol: NOT DETECTED

## 2024-06-29 LAB — PROTIME-INR
INR: 0.9 (ref 0.8–1.2)
Prothrombin Time: 13.2 s (ref 11.4–15.2)

## 2024-06-29 LAB — I-STAT CHEM 8, ED
BUN: 24 mg/dL — ABNORMAL HIGH (ref 8–23)
Calcium, Ion: 1.16 mmol/L (ref 1.15–1.40)
Chloride: 107 mmol/L (ref 98–111)
Creatinine, Ser: 0.7 mg/dL (ref 0.44–1.00)
Glucose, Bld: 152 mg/dL — ABNORMAL HIGH (ref 70–99)
HCT: 35 % — ABNORMAL LOW (ref 36.0–46.0)
Hemoglobin: 11.9 g/dL — ABNORMAL LOW (ref 12.0–15.0)
Potassium: 4.1 mmol/L (ref 3.5–5.1)
Sodium: 140 mmol/L (ref 135–145)
TCO2: 22 mmol/L (ref 22–32)

## 2024-06-29 LAB — ETHANOL: Alcohol, Ethyl (B): <15 mg/dL (ref <15)

## 2024-06-29 MED ORDER — IOHEXOL 350 MG/ML SOLN
75.0000 mL | Freq: Once | INTRAVENOUS | Status: AC | PRN
  Administered 2024-06-29: 75 mL via INTRAVENOUS

## 2024-06-29 MED ORDER — MECLIZINE HCL 25 MG PO TABS
25.0000 mg | ORAL_TABLET | Freq: Once | ORAL | Status: AC
  Administered 2024-06-29: 25 mg via ORAL
  Filled 2024-06-29: qty 1

## 2024-06-29 MED ORDER — MECLIZINE HCL 25 MG PO TABS: 25.0000 mg | ORAL_TABLET | Freq: Three times a day (TID) | ORAL | 0 refills | Status: AC | PRN

## 2024-06-29 NOTE — ED Triage Notes (Signed)
 Patient c/o dizziness at 9 am this morning and difficulty speaking yesterday. Patient was seen at [facility] for further workup for possible TIA. Patient denies N/V. Patient denies chest pain and SOB. Patient ambulatory at triage.

## 2024-06-29 NOTE — ED Provider Notes (Signed)
 Emergency Department Provider Note   I have reviewed the triage vital signs and the nursing notes.   HISTORY  Chief Complaint Dizziness   HPI Kayla Sampson is a 78 y.o. female with past history reviewed below presents emergency department with vertigo upon waking this morning.  She was last normal at 1 AM when she went to bed.  She states she woke up with severe sensation of spinning and vertigo.  This has been present for most of the day although has improved throughout the day.  Symptoms are worse with movement.  She has no associated weakness or numbness.  No headaches.  She has some chronic neck pain which is unchanged.  She had a single episode of abnormal speech yesterday.  She states she was trying to tell her husband something and the word came out completely wrong.  She then immediately corrected with the proper word and has not had an issue since.  She was initially evaluated at urgent care who then referred her here for further evaluation.   Past Medical History:  Diagnosis Date   Actinic keratosis 02/13/2016   Acute kidney injury (HCC) 05/05/2021   Acute right-sided thoracic back pain 10/13/2023   Age-related nuclear cataract, bilateral 02/13/2016   Age-related osteoporosis without current pathological fracture 10/13/2023   Allergic rhinitis 02/13/2016   Anterior basement membrane dystrophy 02/28/2020   Aortic atherosclerosis (HCC) 02/04/2023   Benign essential tremor syndrome 05/31/2022   Cardiac murmur 02/04/2023   Cardiomegaly 01/09/2023   Cataract 02/13/2016   Cervical stenosis of spine    Chronic pain of left knee 04/15/2023   Class 1 obesity due to excess calories with serious comorbidity and body mass index (BMI) of 34.0 to 34.9 in adult 11/03/2021   Closed fracture of distal end of right radius 03/06/2016   Closed fracture of right wrist 03/06/2016   COVID-19 07/08/2022   Current mild episode of major depressive disorder without prior episode (HCC)  04/30/2017   Depression 01/29/2023   Diabetes mellitus (HCC) 02/04/2023   Drug therapy 02/13/2016   Dry eye syndrome, bilateral 02/28/2020   Dyspnea on exertion 02/04/2023   Early dry stage nonexudative age-related macular degeneration of both eyes 06/30/2017   Essential hypertension 03/06/2016   Fall at home 03/06/2016   Gait abnormality 02/16/2020   GERD (gastroesophageal reflux disease) 02/13/2016   Grief 04/15/2023   Herpes zoster without complication 06/26/2021   Hiatal hernia    High risk medication use 06/30/2017   Hypertension    Hypothyroidism 01/29/2023   Imbalance 04/08/2016   Increased urinary frequency 01/29/2023   Inflammatory arthritis 02/13/2016   Kidney stone    Laryngeal cyst 05/08/2022   Mcgwire Dasaro-term use of Plaquenil 02/16/2020   Low back pain 05/20/2019   Lung nodule, multiple 01/29/2023   Microscopic hematuria 01/29/2023   Mixed dyslipidemia 02/04/2023   Neck pain 04/08/2016   Nephrolithiasis 02/16/2020   Nondisplaced fracture of right ulna styloid process, initial encounter for closed fracture 03/06/2016   Normocytic anemia 03/06/2016   Nuclear sclerotic cataract of right eye 07/22/2023   OAB (overactive bladder) 05/31/2022   Obesity (BMI 30.0-34.9) 02/04/2023   Occasional tremors    Omphalitis in adult 02/13/2016   Osteoarthritis    Osteopenia 02/13/2016   Formatting of this note might be different from the original. actonel started 04/19/15   Prediabetes 05/17/2022   Presbyopia of both eyes 02/13/2016   Recurrent UTI 05/31/2022   Sialoadenitis 01/29/2023   Sjogren syndrome with other organ involvement (HCC)  05/31/2022   Sjogren's syndrome with keratoconjunctivitis sicca (HCC) 02/13/2016   SOB (shortness of breath) on exertion 04/01/2017   Spinal stenosis, cervical region 02/13/2016   Squamous 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 eyes 02/28/2020   Trigeminal neuralgia pain    Vitreous floater, bilateral 11/30/2018    Review of  Systems  Constitutional: No fever/chills Eyes: No visual changes. ENT: No sore throat. No tinnitus. Positive vertigo.  Cardiovascular: Denies chest pain. Respiratory: Denies shortness of breath. Gastrointestinal: No abdominal pain.  No nausea, no vomiting.  Skin: Negative for rash. Neurological: Negative for headaches, focal weakness or numbness.  ____________________________________________   PHYSICAL EXAM:  VITAL SIGNS: ED Triage Vitals  Encounter Vitals Group     BP 06/29/24 1848 (!) 152/74     Pulse Rate 06/29/24 1846 86     Resp 06/29/24 1846 16     Temp 06/29/24 1846 97.8 F (36.6 C)     Temp src --      SpO2 06/29/24 1846 100 %     Weight 06/29/24 1845 180 lb 12.4 oz (82 kg)     Height 06/29/24 1845 5' 3 (1.6 m)   Constitutional: Alert and oriented. Well appearing and in no acute distress. Eyes: Conjunctivae are normal. PERRL. EOMI. Mild horizontal nystagmus with left gaze.  Head: Atraumatic. Nose: No congestion/rhinnorhea. Mouth/Throat: Mucous membranes are moist. Neck: No stridor.   Cardiovascular: Normal rate, regular rhythm. Good peripheral circulation. Grossly normal heart sounds.   Respiratory: Normal respiratory effort.  No retractions. Lungs CTAB. Gastrointestinal: Soft and nontender. No distention.  Musculoskeletal: No lower extremity tenderness nor edema. No gross deformities of extremities. Neurologic:  Normal speech and language. No gross focal neurologic deficits are appreciated.  Normal finger-to-nose testing.  5/5 strength in the bilateral upper and lower extremities with normal sensation.  No facial asymmetry.  Skin:  Skin is warm, dry and intact. No rash noted.  ____________________________________________   LABS (all labs ordered are listed, but only abnormal results are displayed)  Labs Reviewed  CBC WITH DIFFERENTIAL/PLATELET - Abnormal; Notable for the following components:      Result Value   RBC 3.59 (*)    Hemoglobin 11.0 (*)    HCT  34.5 (*)    All other components within normal limits  COMPREHENSIVE METABOLIC PANEL WITH GFR - Abnormal; Notable for the following components:   Glucose, Bld 165 (*)    BUN 25 (*)    Calcium  8.6 (*)    Albumin 3.4 (*)    All other components within normal limits  I-STAT CHEM 8, ED - Abnormal; Notable for the following components:   BUN 24 (*)    Glucose, Bld 152 (*)    Hemoglobin 11.9 (*)    HCT 35.0 (*)    All other components within normal limits  ETHANOL  PROTIME-INR  RAPID URINE DRUG SCREEN, HOSP PERFORMED   ____________________________________________  EKG   EKG Interpretation Date/Time:  Tuesday June 29 2024 19:18:09 EDT Ventricular Rate:  60 PR Interval:  172 QRS Duration:  107 QT Interval:  474 QTC Calculation: 474 R Axis:   64  Text Interpretation: Sinus rhythm Low voltage, precordial leads Confirmed by Darra Chew [phone]) on 06/29/2024 7:46:05 PM        ____________________________________________  RADIOLOGY  No results found.  ____________________________________________   PROCEDURES  Procedure(s) performed:   Procedures  None ____________________________________________   INITIAL IMPRESSION / ASSESSMENT AND PLAN / ED COURSE  Pertinent labs & imaging results that  were available during my care of the patient were reviewed by me and considered in my medical decision making (see chart for details).   This patient is Presenting for Evaluation of vertigo, which does require a range of treatment options, and is a complaint that involves a high risk of morbidity and mortality.  The Differential Diagnoses include BPPV, Mnire's disease, CVA, TIA, etc.  Critical Interventions-    Medications  meclizine  (ANTIVERT ) tablet 25 mg (25 mg Oral Given 06/29/24 1957)  iohexol  (OMNIPAQUE ) 350 MG/ML injection 75 mL (75 mLs Intravenous Contrast Given 06/29/24 2128)    Reassessment after intervention:  symptoms improved.    I did obtain Additional  Historical Information from husband at bedside.    Clinical Laboratory Tests Ordered, included CBC without leukocytosis.  Mild anemia to 11.  No AKI.  Radiologic Tests Ordered, included CTA head/neck. I independently interpreted the images and agree with radiology interpretation.   Cardiac Monitor Tracing which shows NSR.    Social Determinants of Health Risk patient is a non-smoker.   Consult complete with  Medical Decision Making: Summary:  The patient presents to the emergency department for evaluation of vertigo.  No focal deficits on exam.  Seems most consistent with peripheral process but patient does have several risk factors for central CVA.  Plan for CTA head and neck.  She describes extreme claustrophobia with MRI in the past.   Reevaluation with update and discussion with patient. Patient feeling much better after Meclizine . She is up and ambulatory without difficulty.   ***Considered admission***  Patient's presentation is most consistent with acute presentation with potential threat to life or bodily function.   Disposition:   ____________________________________________  FINAL CLINICAL IMPRESSION(S) / ED DIAGNOSES  Final diagnoses:  Vertigo     NEW OUTPATIENT MEDICATIONS STARTED DURING THIS VISIT:  New Prescriptions   MECLIZINE  (ANTIVERT ) 25 MG TABLET    Take 1 tablet (25 mg total) by mouth 3 (three) times daily as needed for dizziness.    Note:  This document was prepared using Dragon voice recognition software and may include unintentional dictation errors.  Fonda Law, MD, [facility] Emergency [facility]

## 2024-06-29 NOTE — ED Notes (Signed)
 Discharge instructions reviewed.   Newly prescribed medications discussed. Pharmacy verified.   Opportunity for questions and concerns provided.   Alert, oriented and ambulatory with cane.   Displays no signs of distress.   Declined discharge vitals.

## 2024-06-29 NOTE — Discharge Instructions (Addendum)
 Please use the meclizine  as needed for vertigo. Call your primary care doctor for an appointment in the coming week. Return with any new or worsening symptoms.   There was an incidental finding of a thyroid nodule. Please discuss with your primary care doctor for further testing.

## 2024-07-13 DIAGNOSIS — M1990 Unspecified osteoarthritis, unspecified site: Secondary | ICD-10-CM | POA: Diagnosis not present

## 2024-07-13 DIAGNOSIS — Z79899 Other long term (current) drug therapy: Secondary | ICD-10-CM | POA: Diagnosis not present

## 2024-07-13 DIAGNOSIS — M3501 Sicca syndrome with keratoconjunctivitis: Secondary | ICD-10-CM | POA: Diagnosis not present

## 2024-07-16 ENCOUNTER — Ambulatory Visit
Admission: RE | Admit: 2024-07-16 | Discharge: 2024-07-16 | Disposition: A | Discharge status: Home / Self Care | Source: Ambulatory Visit | Attending: Family Medicine | Admitting: Family Medicine

## 2024-07-16 DIAGNOSIS — Z1231 Encounter for screening mammogram for malignant neoplasm of breast: Secondary | ICD-10-CM

## 2024-07-19 DIAGNOSIS — R7303 Prediabetes: Secondary | ICD-10-CM | POA: Diagnosis not present

## 2024-07-19 DIAGNOSIS — H52203 Unspecified astigmatism, bilateral: Secondary | ICD-10-CM | POA: Diagnosis not present

## 2024-07-19 DIAGNOSIS — H43393 Other vitreous opacities, bilateral: Secondary | ICD-10-CM | POA: Diagnosis not present

## 2024-07-19 DIAGNOSIS — H35371 Puckering of macula, right eye: Secondary | ICD-10-CM | POA: Diagnosis not present

## 2024-07-19 DIAGNOSIS — H0100A Unspecified blepharitis right eye, upper and lower eyelids: Secondary | ICD-10-CM | POA: Diagnosis not present

## 2024-07-19 DIAGNOSIS — H18523 Epithelial (juvenile) corneal dystrophy, bilateral: Secondary | ICD-10-CM | POA: Diagnosis not present

## 2024-07-19 DIAGNOSIS — Z79899 Other long term (current) drug therapy: Secondary | ICD-10-CM | POA: Diagnosis not present

## 2024-07-19 DIAGNOSIS — M3501 Sicca syndrome with keratoconjunctivitis: Secondary | ICD-10-CM | POA: Diagnosis not present

## 2024-07-19 DIAGNOSIS — H353131 Nonexudative age-related macular degeneration, bilateral, early dry stage: Secondary | ICD-10-CM | POA: Diagnosis not present

## 2024-07-21 DIAGNOSIS — Z79899 Other long term (current) drug therapy: Secondary | ICD-10-CM | POA: Diagnosis not present

## 2024-07-21 DIAGNOSIS — M3501 Sicca syndrome with keratoconjunctivitis: Secondary | ICD-10-CM | POA: Diagnosis not present

## 2024-07-21 DIAGNOSIS — M1990 Unspecified osteoarthritis, unspecified site: Secondary | ICD-10-CM | POA: Diagnosis not present

## 2024-08-17 ENCOUNTER — Other Ambulatory Visit: Payer: Self-pay | Admitting: Family Medicine

## 2024-08-20 ENCOUNTER — Telehealth: Payer: Self-pay

## 2024-08-20 NOTE — Telephone Encounter (Signed)
 Copied from CRM [id number]. Topic: Clinical - Medication Question >> Aug 19, 2024 12:57 PM Ahlexyia S wrote: Reason for CRM: Pt called in wanting to get the new Covid vaccine prescription.

## 2024-08-20 NOTE — Telephone Encounter (Signed)
 Spoke to patient and informed her that with new CDC guidelines a OV is needed. Patient verbalized understanding and OV is scheduled for  08/26/2024

## 2024-08-26 ENCOUNTER — Ambulatory Visit: Admitting: Family Medicine

## 2024-09-02 NOTE — Progress Notes (Signed)
 Kayla Sampson                                          MRN: [id number]   09/02/2024   The [facility] Quality Team Specialist reviewed this patient medical record for the purposes of chart review for care gap closure. The following were reviewed: chart review for care gap closure-kidney health evaluation for diabetes:eGFR  and uACR.    [facility] Quality Team

## 2024-09-21 DIAGNOSIS — N200 Calculus of kidney: Secondary | ICD-10-CM | POA: Diagnosis not present

## 2024-09-21 DIAGNOSIS — R8290 Unspecified abnormal findings in urine: Secondary | ICD-10-CM | POA: Diagnosis not present

## 2024-10-11 DIAGNOSIS — Z7984 Long term (current) use of oral hypoglycemic drugs: Secondary | ICD-10-CM | POA: Diagnosis not present

## 2024-10-11 DIAGNOSIS — Z79899 Other long term (current) drug therapy: Secondary | ICD-10-CM | POA: Diagnosis not present

## 2024-10-11 DIAGNOSIS — Z882 Allergy status to sulfonamides status: Secondary | ICD-10-CM | POA: Diagnosis not present

## 2024-10-11 DIAGNOSIS — E119 Type 2 diabetes mellitus without complications: Secondary | ICD-10-CM | POA: Diagnosis not present

## 2024-10-11 DIAGNOSIS — K449 Diaphragmatic hernia without obstruction or gangrene: Secondary | ICD-10-CM | POA: Diagnosis not present

## 2024-10-11 DIAGNOSIS — N200 Calculus of kidney: Secondary | ICD-10-CM | POA: Diagnosis not present

## 2024-10-11 DIAGNOSIS — Z888 Allergy status to other drugs, medicaments and biological substances status: Secondary | ICD-10-CM | POA: Diagnosis not present

## 2024-10-11 DIAGNOSIS — R8290 Unspecified abnormal findings in urine: Secondary | ICD-10-CM | POA: Diagnosis not present

## 2024-10-11 DIAGNOSIS — I10 Essential (primary) hypertension: Secondary | ICD-10-CM | POA: Diagnosis not present

## 2024-10-12 ENCOUNTER — Telehealth: Payer: Self-pay

## 2024-10-12 NOTE — Telephone Encounter (Signed)
 Copied from CRM #[id number]. Topic: General - Call Back - No Documentation >> Oct 12, 2024  9:25 AM Carlyon D wrote: Reason for CRM:  Pt is calling in regards to a missed call she just received from the office, I do not see any documentation on anyone calling her,  If anyone reached out to pt please call her back .

## 2024-10-12 NOTE — Telephone Encounter (Signed)
 No missed phone call noted

## 2024-10-13 DIAGNOSIS — M545 Low back pain, unspecified: Secondary | ICD-10-CM | POA: Diagnosis not present

## 2024-10-13 DIAGNOSIS — M4802 Spinal stenosis, cervical region: Secondary | ICD-10-CM | POA: Diagnosis not present

## 2024-10-13 DIAGNOSIS — G8929 Other chronic pain: Secondary | ICD-10-CM | POA: Diagnosis not present

## 2024-10-18 ENCOUNTER — Ambulatory Visit: Admitting: Family Medicine

## 2024-10-19 ENCOUNTER — Encounter: Payer: Self-pay | Admitting: Family Medicine

## 2024-10-19 ENCOUNTER — Ambulatory Visit: Admitting: Family Medicine

## 2024-10-19 VITALS — BP 136/56 | HR 60 | Temp 97.0°F | Resp 18 | Wt 184.8 lb

## 2024-10-19 DIAGNOSIS — Z7984 Long term (current) use of oral hypoglycemic drugs: Secondary | ICD-10-CM | POA: Diagnosis not present

## 2024-10-19 DIAGNOSIS — M545 Low back pain, unspecified: Secondary | ICD-10-CM | POA: Diagnosis not present

## 2024-10-19 DIAGNOSIS — I10 Essential (primary) hypertension: Secondary | ICD-10-CM

## 2024-10-19 DIAGNOSIS — K1120 Sialoadenitis, unspecified: Secondary | ICD-10-CM

## 2024-10-19 DIAGNOSIS — M3509 Sicca syndrome with other organ involvement: Secondary | ICD-10-CM | POA: Diagnosis not present

## 2024-10-19 DIAGNOSIS — Z6832 Body mass index (BMI) 32.0-32.9, adult: Secondary | ICD-10-CM

## 2024-10-19 DIAGNOSIS — E1136 Type 2 diabetes mellitus with diabetic cataract: Secondary | ICD-10-CM | POA: Diagnosis not present

## 2024-10-19 DIAGNOSIS — N200 Calculus of kidney: Secondary | ICD-10-CM | POA: Diagnosis not present

## 2024-10-19 DIAGNOSIS — G8929 Other chronic pain: Secondary | ICD-10-CM

## 2024-10-19 DIAGNOSIS — F320 Major depressive disorder, single episode, mild: Secondary | ICD-10-CM | POA: Diagnosis not present

## 2024-10-19 DIAGNOSIS — E66811 Obesity, class 1: Secondary | ICD-10-CM

## 2024-10-19 LAB — POCT GLYCOSYLATED HEMOGLOBIN (HGB A1C): Hemoglobin A1C: 5.9 % — AB (ref 4.0–5.6)

## 2024-10-19 NOTE — Progress Notes (Signed)
 Assessment & Plan   Assessment/Plan:   Assessment and Plan Assessment & Plan Type 2 diabetes mellitus Well-controlled with an A1c of 5.9%. - Continue metformin  500 mg daily.  Hypertension Blood pressure readings have been variable, with recent home readings of 160/70 mmHg and in-office readings of 136/54 mmHg. Stress may be contributing to elevated readings. - Continue amlodipine -olmesartan  10 mg-20 mg daily. - Continue metoprolol  25 mg daily.  Hyperlipidemia Management is ongoing. Last lipid panel was over a year ago. - Schedule a fasting lipid panel after Thanksgiving.  Obesity BMI is 32.7, indicating obesity.  Depression Managed with duloxetine  120 mg daily. Mood is well-managed with medication. - Continue duloxetine  120 mg daily.  Chronic lower back pain Managed with duloxetine  120 mg daily. - Continue duloxetine  120 mg daily.  Keratoconjunctivitis sicca due to Sjogren's syndrome Managed by rheumatology. Follow-up with rheumatologist is scheduled. - Continue follow-up with rheumatology.  Nephrolithiasis, status post lithotripsy Recent lithotripsy performed. Post-operative follow-up with urologist is scheduled. - Attend post-operative appointment with urologist.  Salivary gland infection Recent salivary gland infection, likely related to Sjogren's syndrome. - Continue follow-up with rheumatology.         There are no discontinued medications.  No follow-ups on file.        Subjective:   Encounter date: 10/19/2024  Kayla Sampson is a 78 y.o. female who has Allergic rhinitis; Current mild episode of major depressive disorder without prior episode; GERD (gastroesophageal reflux disease); Inflammatory arthritis; Omphalitis in adult; Prediabetes; Cataract; Nephrolithiasis; Essential hypertension; Sjogren syndrome with other organ involvement; OAB (overactive bladder); Recurrent UTI; Hiatal hernia; Benign essential tremor syndrome; COVID-19; Cardiomegaly;  Cervical stenosis of spine; Hypertension; Kidney stone; Occasional tremors; Osteoarthritis; Trigeminal neuralgia pain; Actinic keratosis; Anterior basement membrane dystrophy; Class 1 obesity due to excess calories with serious comorbidity and body mass index (BMI) of 34.0 to 34.9 in adult; Closed fracture of distal end of right radius; Closed fracture of right wrist; Depression; Drug therapy; Dry eye syndrome, bilateral; Early dry stage nonexudative age-related macular degeneration of both eyes; Fall at home; Gait abnormality; Herpes zoster without complication; High risk medication use; Hypothyroidism; Imbalance; Increased urinary frequency; Laryngeal cyst; Long-term use of Plaquenil; Low back pain; Lung nodule, multiple; Microscopic hematuria; Nondisplaced fracture of right ulna styloid process, initial encounter for closed fracture; Normocytic anemia; Osteopenia; Presbyopia of both eyes; SOB (shortness of breath) on exertion; Squamous 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 eyes; Vitreous floater, bilateral; Spinal stenosis, cervical region; Sialoadenitis; Acute kidney injury; Sjogren's syndrome with keratoconjunctivitis sicca; Mixed hyperlipidemia; Diabetes mellitus (HCC); Class 1 drug-induced obesity with serious comorbidity and body mass index (BMI) of 32.0 to 32.9 in adult; Aortic atherosclerosis; Dyspnea on exertion; Cardiac murmur; Grief; Chronic pain of left knee; Age-related osteoporosis without current pathological fracture; Acute right-sided thoracic back pain; Age-related nuclear cataract, bilateral; Nuclear sclerotic cataract of right eye; CKD stage 3 due to type 2 diabetes mellitus (HCC); and Mitral and aortic valve disease on their problem list..   She  has a past medical history of Actinic keratosis (02/13/2016), Acute kidney injury (05/05/2021), Acute right-sided thoracic back pain (10/13/2023), Age-related nuclear cataract, bilateral (02/13/2016), Age-related osteoporosis without  current pathological fracture (10/13/2023), Allergic rhinitis (02/13/2016), Anterior basement membrane dystrophy (02/28/2020), Aortic atherosclerosis (02/04/2023), Benign essential tremor syndrome (05/31/2022), Cardiac murmur (02/04/2023), Cardiomegaly (01/09/2023), Cataract (02/13/2016), Cervical stenosis of spine, Chronic pain of left knee (04/15/2023), Class 1 obesity due to excess calories with serious comorbidity and body mass index (BMI) of 34.0 to 34.9 in  adult (11/03/2021), Closed fracture of distal end of right radius (03/06/2016), Closed fracture of right wrist (03/06/2016), COVID-19 (07/08/2022), Current mild episode of major depressive disorder without prior episode (04/30/2017), Depression (01/29/2023), Diabetes mellitus (HCC) (02/04/2023), Drug therapy (02/13/2016), Dry eye syndrome, bilateral (02/28/2020), Dyspnea on exertion (02/04/2023), Early dry stage nonexudative age-related macular degeneration of both eyes (06/30/2017), Essential hypertension (03/06/2016), Fall at home (03/06/2016), Gait abnormality (02/16/2020), GERD (gastroesophageal reflux disease) (02/13/2016), Grief (04/15/2023), Herpes zoster without complication (06/26/2021), Hiatal hernia, High risk medication use (06/30/2017), Hypertension, Hypothyroidism (01/29/2023), Imbalance (04/08/2016), Increased urinary frequency (01/29/2023), Inflammatory arthritis (02/13/2016), Kidney stone, Laryngeal cyst (05/08/2022), Long-term use of Plaquenil (02/16/2020), Low back pain (05/20/2019), Lung nodule, multiple (01/29/2023), Microscopic hematuria (01/29/2023), Mixed dyslipidemia (02/04/2023), Neck pain (04/08/2016), Nephrolithiasis (02/16/2020), Nondisplaced fracture of right ulna styloid process, initial encounter for closed fracture (03/06/2016), Normocytic anemia (03/06/2016), Nuclear sclerotic cataract of right eye (07/22/2023), OAB (overactive bladder) (05/31/2022), Obesity (BMI 30.0-34.9) (02/04/2023), Occasional tremors, Omphalitis in  adult (02/13/2016), Osteoarthritis, Osteopenia (02/13/2016), Prediabetes (05/17/2022), Presbyopia of both eyes (02/13/2016), Recurrent UTI (05/31/2022), Sialoadenitis (01/29/2023), Sjogren syndrome with other organ involvement (05/31/2022), Sjogren's syndrome with keratoconjunctivitis sicca (02/13/2016), SOB (shortness of breath) on exertion (04/01/2017), Spinal stenosis, cervical region (02/13/2016), Squamous 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   She presents with chief complaint of Diabetes (6 month follow up. Pt is fasting today //HM due- diabetic eye and foot exam ), Hypertension (Pt stated blood pressure at home range from 160/70 ), and Dizziness (Pt c/o of dizziness for a few weeks ) .   Discussed the use of AI scribe software for clinical note transcription with the patient, who gave verbal consent to proceed.  History of Present Illness           Chronic pain due to chronic lower back pain Managed with duloxetine  120 mg daily    Depression, unspecified - Managed with duloxetine  120 mg.   Type 2 diabetes mellitus - Managed with metformin  500 mg daily. - Well-controlled with an A1c of 6.0%.    Hypertension - Managed with amlodipine  10-20 mg daily and metoprolol  25 mg daily.   Hyperlipidemia - Managed with rosuvastatin  10 mg daily.  Obesity - with a BMI of 32.   Keratoconjunctivitis sicca due to Sjogren's syndrome - Secondary to Sjogren's syndrome.  - Managed with methotrexate, which has improved symptoms but not completely resolved them.  - She manages fatigue with naps.  ROS  Past Surgical History:  Procedure Laterality Date   ABDOMINAL HYSTERECTOMY     ANKLE FRACTURE SURGERY     ANTERIOR CERVICAL DECOMP/DISCECTOMY FUSION     FRACTURE SURGERY     SPINE SURGERY     THYROGLOSSAL DUCT CYST     TUBAL LIGATION      Current Outpatient Medications on File Prior to  Visit  Medication Sig Dispense Refill   amlodipine -olmesartan  (AZOR ) 10-20 MG tablet TAKE 1 TABLET BY MOUTH DAILY 90 tablet 0   diclofenac  Sodium (VOLTAREN ) 1 % GEL Apply 4 g topically 4 (four) times daily as needed. 100 g 3   DULoxetine  (CYMBALTA ) 60 MG capsule Take 1 capsule (60 mg total) by mouth daily. 90 capsule 3   folic acid (FOLVITE) 1 MG tablet Take 1 tablet by mouth daily.     hydroxychloroquine (PLAQUENIL) 200 MG tablet Take 200 mg by mouth 2 (two) times daily.     meclizine  (ANTIVERT ) 25 MG tablet Take 1 tablet (25 mg total)  by mouth 3 (three) times daily as needed for dizziness. 30 tablet 0   metFORMIN  (GLUCOPHAGE -XR) 500 MG 24 hr tablet TAKE 1 TABLET(500 MG) BY MOUTH DAILY 90 tablet 2   methotrexate (RHEUMATREX) 2.5 MG tablet Take 7 tablets by mouth once a week.     metoprolol  succinate (TOPROL -XL) 25 MG 24 hr tablet TAKE 1 TABLET(25 MG) BY MOUTH DAILY 90 tablet 1   Multiple Vitamins-Minerals (PRESERVISION AREDS 2) CAPS Take 1 tablet by mouth 2 (two) times daily.     MYRBETRIQ 25 MG TB24 tablet Take 25 mg by mouth daily.     rosuvastatin  (CRESTOR ) 10 MG tablet TAKE 1 TABLET(10 MG) BY MOUTH DAILY 90 tablet 3   No current facility-administered medications on file prior to visit.    Family History  Problem Relation Age of Onset   Hypertension Mother    Heart attack Father    Hypertension Father    Diabetes Father    Heart disease Father    Cancer Paternal Grandfather     Social History   Socioeconomic History   Marital status: Married    Spouse name: Not on file   Number of children: Not on file   Years of education: Not on file   Highest education level: Some college, no degree  Occupational History   Not on file  Tobacco Use   Smoking status: Never   Smokeless tobacco: Not on file  Vaping Use   Vaping status: Never Used  Substance and Sexual Activity   Alcohol use: No   Drug use: No   Sexual activity: Not on file  Other Topics Concern   Not on file   Social History Narrative   Not on file   Social Drivers of Health   Financial Resource Strain: Low Risk  (10/15/2024)   Overall Financial Resource Strain (CARDIA)    Difficulty of Paying Living Expenses: Not hard at all  Food Insecurity: No Food Insecurity (10/15/2024)   Hunger Vital Sign    Worried About Running Out of Food in the Last Year: Never true    Ran Out of Food in the Last Year: Never true  Transportation Needs: No Transportation Needs (10/15/2024)   PRAPARE - Administrator, Civil Service (Medical): No    Lack of Transportation (Non-Medical): No  Physical Activity: Inactive (10/15/2024)   Exercise Vital Sign    Days of Exercise per Week: 0 days    Minutes of Exercise per Session: Not on file  Stress: No Stress Concern Present (10/15/2024)   [employer] of Occupational Health - Occupational Stress Questionnaire    Feeling of Stress: Only a little  Social Connections: Moderately Isolated (10/15/2024)   Social Connection and Isolation Panel    Frequency of Communication with Friends and Family: More than three times a week    Frequency of Social Gatherings with Friends and Family: Once a week    Attends Religious Services: Never    Database Administrator or Organizations: No    Attends Engineer, Structural: Not on file    Marital Status: Married  Intimate Partner Violence: Unknown (10/22/2022)   Received from [facility]   HITS    Physically Hurt: Not on file    Insult or Talk Down To: Not on file    Threaten Physical Harm: Not on file    Scream or Curse: Not on file  Objective:  Physical Exam: BP (!) 136/56 (BP Location: Right Arm, Patient Position: Sitting, Cuff Size: Large) Comment: recheck  Pulse 60   Temp (!) 97 F (36.1 C) (Temporal)   Resp 18   Wt 184 lb 12.8 oz (83.8 kg)   SpO2 100%   BMI 32.74 kg/m    Physical Exam           Orthostatic VS for the past 72 hrs (Last 3 readings):  Orthostatic BP Patient Position BP Location Cuff Size Orthostatic Pulse  10/19/24 1135 140/68 Standing Right Arm Large 60  10/19/24 1134 150/69 Sitting Left Arm Large 60  10/19/24 1133 157/65 Supine Left Arm Large 57    Physical Exam  No results found.  Recent Results (from the past 2160 hours)  POCT glycosylated hemoglobin (Hb A1C)     Status: Abnormal   Collection Time: 10/19/24 11:16 AM  Result Value Ref Range   Hemoglobin A1C 5.9 (A) 4.0 - 5.6 %   HbA1c POC (<> result, manual entry)     HbA1c, POC (prediabetic range)     HbA1c, POC (controlled diabetic range)          Beverley KATHEE Hummer, MD  I,Emily Lagle,acting as a scribe for Beverley KATHEE Hummer, MD.,have documented all relevant documentation on the behalf of Beverley KATHEE Hummer, MD.  LILLETTE Beverley KATHEE Hummer, MD, have reviewed all documentation for this visit. The documentation on 10/19/2024 for the exam, diagnosis, procedures, and orders are all accurate and complete.

## 2024-10-21 DIAGNOSIS — M1990 Unspecified osteoarthritis, unspecified site: Secondary | ICD-10-CM | POA: Diagnosis not present

## 2024-10-21 DIAGNOSIS — M3501 Sicca syndrome with keratoconjunctivitis: Secondary | ICD-10-CM | POA: Diagnosis not present

## 2024-10-21 DIAGNOSIS — M2550 Pain in unspecified joint: Secondary | ICD-10-CM | POA: Diagnosis not present

## 2024-11-11 ENCOUNTER — Telehealth: Payer: Self-pay

## 2024-11-11 NOTE — Telephone Encounter (Signed)
 Copied from CRM [id number]. Topic: Clinical - Request for Lab/Test Order >> Nov 09, 2024  3:38 PM Berneda FALCON wrote: Reason for CRM: Patient states Dr. Sebastian was supposed to put in fasting lab orders for patient, but I do not see any in her chart as of yet, to schedule her.  Is this something we can add for her, please?  Patient phone number is [phone] (home)

## 2024-11-16 ENCOUNTER — Other Ambulatory Visit: Payer: Self-pay | Admitting: Family Medicine

## 2024-11-16 ENCOUNTER — Telehealth: Payer: Self-pay

## 2024-11-16 DIAGNOSIS — G250 Essential tremor: Secondary | ICD-10-CM

## 2024-11-16 DIAGNOSIS — I517 Cardiomegaly: Secondary | ICD-10-CM

## 2024-11-16 DIAGNOSIS — M1380 Other specified arthritis, unspecified site: Secondary | ICD-10-CM

## 2024-11-16 DIAGNOSIS — M3509 Sicca syndrome with other organ involvement: Secondary | ICD-10-CM

## 2024-11-16 DIAGNOSIS — I10 Essential (primary) hypertension: Secondary | ICD-10-CM

## 2024-11-16 DIAGNOSIS — I700 Atherosclerosis of aorta: Secondary | ICD-10-CM

## 2024-11-16 NOTE — Telephone Encounter (Signed)
 Copied from CRM [id number]. Topic: Clinical - Request for Lab/Test Order >> Nov 09, 2024  3:38 PM Berneda FALCON wrote: Reason for CRM: Patient states Dr. Sebastian was supposed to put in fasting lab orders for patient, but I do not see any in her chart as of yet, to schedule her.  Is this something we can add for her, please?  Patient phone number is [phone] (home) >> Nov 16, 2024  2:31 PM Rosina BIRCH wrote: Patient called stating she is checking on some lab orders that MD Sebastian wanted her to get after her physical. She stated no one has called her back regarding this.  I spoke with Jon at the office and she stated she will speak to the provider and call the patient back [phone]

## 2024-11-17 NOTE — Telephone Encounter (Signed)
 Pt is scheduled or fasting labs on 11/19/2024 @ 8:40am

## 2024-11-19 ENCOUNTER — Other Ambulatory Visit

## 2024-11-19 DIAGNOSIS — M1380 Other specified arthritis, unspecified site: Secondary | ICD-10-CM

## 2024-11-19 DIAGNOSIS — I517 Cardiomegaly: Secondary | ICD-10-CM

## 2024-11-19 DIAGNOSIS — I10 Essential (primary) hypertension: Secondary | ICD-10-CM

## 2024-11-19 DIAGNOSIS — M3509 Sicca syndrome with other organ involvement: Secondary | ICD-10-CM | POA: Diagnosis not present

## 2024-11-19 LAB — COMPREHENSIVE METABOLIC PANEL WITH GFR
ALT: 14 U/L (ref 0–35)
AST: 18 U/L (ref 0–37)
Albumin: 4 g/dL (ref 3.5–5.2)
Alkaline Phosphatase: 65 U/L (ref 39–117)
BUN: 23 mg/dL (ref 6–23)
CO2: 25 meq/L (ref 19–32)
Calcium: 9.1 mg/dL (ref 8.4–10.5)
Chloride: 108 meq/L (ref 96–112)
Creatinine, Ser: 0.73 mg/dL (ref 0.40–1.20)
GFR: 79.88 mL/min (ref >60.00)
Glucose, Bld: 119 mg/dL — ABNORMAL HIGH (ref 70–99)
Potassium: 4.4 meq/L (ref 3.5–5.1)
Sodium: 142 meq/L (ref 135–145)
Total Bilirubin: 0.3 mg/dL (ref 0.2–1.2)
Total Protein: 6.9 g/dL (ref 6.0–8.3)

## 2024-11-19 LAB — CBC WITH DIFFERENTIAL/PLATELET
Basophils Absolute: 0.1 K/uL (ref 0.0–0.1)
Basophils Relative: 1 % (ref 0.0–3.0)
Eosinophils Absolute: 0.1 K/uL (ref 0.0–0.7)
Eosinophils Relative: 1.7 % (ref 0.0–5.0)
HCT: 33.6 % — ABNORMAL LOW (ref 36.0–46.0)
Hemoglobin: 11.1 g/dL — ABNORMAL LOW (ref 12.0–15.0)
Lymphocytes Relative: 32.6 % (ref 12.0–46.0)
Lymphs Abs: 1.9 K/uL (ref 0.7–4.0)
MCHC: 33.1 g/dL (ref 30.0–36.0)
MCV: 94.7 fl (ref 78.0–100.0)
Monocytes Absolute: 0.6 K/uL (ref 0.1–1.0)
Monocytes Relative: 10.5 % (ref 3.0–12.0)
Neutro Abs: 3.1 K/uL (ref 1.4–7.7)
Neutrophils Relative %: 54.2 % (ref 43.0–77.0)
Platelets: 261 K/uL (ref 150.0–400.0)
RBC: 3.55 Mil/uL — ABNORMAL LOW (ref 3.87–5.11)
RDW: 15.4 % (ref 11.5–15.5)
WBC: 5.7 K/uL (ref 4.0–10.5)

## 2024-11-19 LAB — LIPID PANEL
Cholesterol: 106 mg/dL (ref 0–200)
HDL: 59.3 mg/dL (ref >39.00)
LDL Cholesterol: 28 mg/dL (ref 0–99)
NonHDL: 46.86
Total CHOL/HDL Ratio: 2
Triglycerides: 92 mg/dL (ref 0.0–149.0)
VLDL: 18.4 mg/dL (ref 0.0–40.0)

## 2024-11-19 LAB — MICROALBUMIN / CREATININE URINE RATIO
Creatinine,U: 124.4 mg/dL
Microalb Creat Ratio: 31.6 mg/g — ABNORMAL HIGH (ref 0.0–30.0)
Microalb, Ur: 3.9 mg/dL — ABNORMAL HIGH (ref 0.0–1.9)

## 2024-11-19 LAB — TSH: TSH: 3.1 u[IU]/mL (ref 0.35–5.50)

## 2024-11-19 LAB — HEMOGLOBIN A1C: Hgb A1c MFr Bld: 6.2 % (ref 4.6–6.5)

## 2024-11-23 LAB — VITAMIN D 1,25 DIHYDROXY
Vitamin D 1, 25 (OH)2 Total: 19 pg/mL (ref 18–72)
Vitamin D2 1, 25 (OH)2: <8 pg/mL
Vitamin D3 1, 25 (OH)2: 19 pg/mL

## 2024-11-23 LAB — URINALYSIS W MICROSCOPIC + REFLEX CULTURE

## 2024-11-24 ENCOUNTER — Ambulatory Visit: Payer: Self-pay | Admitting: Family Medicine

## 2024-11-24 DIAGNOSIS — E1129 Type 2 diabetes mellitus with other diabetic kidney complication: Secondary | ICD-10-CM | POA: Insufficient documentation

## 2024-11-24 DIAGNOSIS — E559 Vitamin D deficiency, unspecified: Secondary | ICD-10-CM

## 2024-11-24 HISTORY — DX: Type 2 diabetes mellitus with other diabetic kidney complication: E11.29

## 2024-11-24 MED ORDER — VITAMIN D (ERGOCALCIFEROL) 1.25 MG (50000 UNIT) PO CAPS: 50000.0000 [IU] | ORAL_CAPSULE | ORAL | 3 refills | Status: AC

## 2025-01-05 ENCOUNTER — Other Ambulatory Visit

## 2025-01-06 ENCOUNTER — Ambulatory Visit: Admitting: Cardiology

## 2025-01-19 ENCOUNTER — Ambulatory Visit (HOSPITAL_BASED_OUTPATIENT_CLINIC_OR_DEPARTMENT_OTHER)

## 2025-02-08 ENCOUNTER — Ambulatory Visit: Admitting: Cardiology

## 2025-04-19 ENCOUNTER — Ambulatory Visit: Admitting: Family Medicine
